# Patient Record
Sex: Female | Born: 1965
Health system: Southern US, Community
[De-identification: ages and names within clinical notes are randomized; demographics above are authoritative.]

## PROBLEM LIST (undated history)

## (undated) DIAGNOSIS — C44301 Unspecified malignant neoplasm of skin of nose: Secondary | ICD-10-CM

## (undated) HISTORY — DX: Unspecified malignant neoplasm of skin of nose: C44.301

## (undated) HISTORY — PX: CYSTECTOMY: SUR359

---

## 1998-09-04 ENCOUNTER — Other Ambulatory Visit: Admission: RE | Admit: 1998-09-04 | Discharge: 1998-09-04 | Payer: Self-pay | Admitting: *Deleted

## 1999-08-02 ENCOUNTER — Emergency Department (HOSPITAL_COMMUNITY): Admission: EM | Admit: 1999-08-02 | Discharge: 1999-08-02 | Payer: Self-pay | Admitting: *Deleted

## 1999-09-25 ENCOUNTER — Other Ambulatory Visit: Admission: RE | Admit: 1999-09-25 | Discharge: 1999-09-25 | Payer: Self-pay | Admitting: *Deleted

## 2000-05-26 ENCOUNTER — Inpatient Hospital Stay (HOSPITAL_COMMUNITY): Admission: AD | Admit: 2000-05-26 | Discharge: 2000-05-26 | Payer: Self-pay | Admitting: Obstetrics and Gynecology

## 2000-08-07 ENCOUNTER — Inpatient Hospital Stay (HOSPITAL_COMMUNITY): Admission: AD | Admit: 2000-08-07 | Discharge: 2000-08-09 | Payer: Self-pay | Admitting: *Deleted

## 2000-09-22 ENCOUNTER — Other Ambulatory Visit: Admission: RE | Admit: 2000-09-22 | Discharge: 2000-09-22 | Payer: Self-pay | Admitting: *Deleted

## 2001-11-27 ENCOUNTER — Other Ambulatory Visit: Admission: RE | Admit: 2001-11-27 | Discharge: 2001-11-27 | Payer: Self-pay | Admitting: *Deleted

## 2002-12-14 ENCOUNTER — Other Ambulatory Visit: Admission: RE | Admit: 2002-12-14 | Discharge: 2002-12-14 | Payer: Self-pay | Admitting: *Deleted

## 2003-08-03 HISTORY — PX: SKIN CANCER EXCISION: SHX779

## 2004-02-17 ENCOUNTER — Other Ambulatory Visit: Admission: RE | Admit: 2004-02-17 | Discharge: 2004-02-17 | Payer: Self-pay | Admitting: Obstetrics and Gynecology

## 2004-12-21 ENCOUNTER — Ambulatory Visit: Payer: Self-pay | Admitting: Internal Medicine

## 2005-04-15 ENCOUNTER — Other Ambulatory Visit: Admission: RE | Admit: 2005-04-15 | Discharge: 2005-04-15 | Payer: Self-pay | Admitting: Obstetrics and Gynecology

## 2005-05-03 ENCOUNTER — Ambulatory Visit: Payer: Self-pay | Admitting: Internal Medicine

## 2005-10-22 ENCOUNTER — Ambulatory Visit: Payer: Self-pay | Admitting: Internal Medicine

## 2006-06-16 ENCOUNTER — Ambulatory Visit: Payer: Self-pay | Admitting: Internal Medicine

## 2006-06-16 LAB — CONVERTED CEMR LAB
ALT: 13 units/L (ref 0–40)
AST: 20 units/L (ref 0–37)
Albumin: 3.4 g/dL — ABNORMAL LOW (ref 3.5–5.2)
Alkaline Phosphatase: 48 units/L (ref 39–117)
BUN: 6 mg/dL (ref 6–23)
Basophils Absolute: 0 10*3/uL (ref 0.0–0.1)
Basophils Relative: 0.1 % (ref 0.0–1.0)
CO2: 29 meq/L (ref 19–32)
Calcium: 8.7 mg/dL (ref 8.4–10.5)
Chloride: 102 meq/L (ref 96–112)
Chol/HDL Ratio, serum: 3.2
Cholesterol: 160 mg/dL (ref 0–200)
Creatinine, Ser: 0.9 mg/dL (ref 0.4–1.2)
Eosinophil percent: 4.8 % (ref 0.0–5.0)
GFR calc non Af Amer: 74 mL/min
Glomerular Filtration Rate, Af Am: 90 mL/min/{1.73_m2}
Glucose, Bld: 84 mg/dL (ref 70–99)
HCT: 41.2 % (ref 36.0–46.0)
HDL: 50.5 mg/dL (ref >39.0)
Hemoglobin: 13.7 g/dL (ref 12.0–15.0)
LDL Cholesterol: 94 mg/dL (ref 0–99)
Lymphocytes Relative: 30.3 % (ref 12.0–46.0)
MCHC: 33.1 g/dL (ref 30.0–36.0)
MCV: 92.7 fL (ref 78.0–100.0)
Monocytes Absolute: 0.4 10*3/uL (ref 0.2–0.7)
Monocytes Relative: 7.1 % (ref 3.0–11.0)
Neutro Abs: 3.1 10*3/uL (ref 1.4–7.7)
Neutrophils Relative %: 57.7 % (ref 43.0–77.0)
Platelets: 281 10*3/uL (ref 150–400)
Potassium: 4 meq/L (ref 3.5–5.1)
RBC: 4.44 M/uL (ref 3.87–5.11)
RDW: 11.6 % (ref 11.5–14.6)
Sodium: 138 meq/L (ref 135–145)
TSH: 1.87 microintl units/mL (ref 0.35–5.50)
Total Bilirubin: 0.6 mg/dL (ref 0.3–1.2)
Total Protein: 6.5 g/dL (ref 6.0–8.3)
Triglyceride fasting, serum: 78 mg/dL (ref 0–149)
VLDL: 16 mg/dL (ref 0–40)
WBC: 5.5 10*3/uL (ref 4.5–10.5)

## 2006-06-30 ENCOUNTER — Ambulatory Visit: Payer: Self-pay | Admitting: Internal Medicine

## 2008-05-20 ENCOUNTER — Encounter: Payer: Self-pay | Admitting: Internal Medicine

## 2010-02-19 ENCOUNTER — Ambulatory Visit: Payer: Self-pay | Admitting: Internal Medicine

## 2010-03-19 ENCOUNTER — Ambulatory Visit: Payer: Self-pay | Admitting: Internal Medicine

## 2010-03-19 LAB — CONVERTED CEMR LAB
ALT: 12 units/L (ref 0–35)
AST: 24 units/L (ref 0–37)
Albumin: 3.8 g/dL (ref 3.5–5.2)
Alkaline Phosphatase: 60 units/L (ref 39–117)
BUN: 11 mg/dL (ref 6–23)
Basophils Absolute: 0 10*3/uL (ref 0.0–0.1)
Basophils Relative: 0.6 % (ref 0.0–3.0)
Bilirubin Urine: NEGATIVE
Bilirubin, Direct: 0.1 mg/dL (ref 0.0–0.3)
CO2: 25 meq/L (ref 19–32)
Calcium: 8.9 mg/dL (ref 8.4–10.5)
Chloride: 108 meq/L (ref 96–112)
Cholesterol: 192 mg/dL (ref 0–200)
Creatinine, Ser: 1.1 mg/dL (ref 0.4–1.2)
Eosinophils Absolute: 0.2 10*3/uL (ref 0.0–0.7)
Eosinophils Relative: 3.2 % (ref 0.0–5.0)
GFR calc non Af Amer: 56.86 mL/min (ref >60)
Glucose, Bld: 130 mg/dL — ABNORMAL HIGH (ref 70–99)
HCT: 34.2 % — ABNORMAL LOW (ref 36.0–46.0)
HDL: 58.2 mg/dL (ref >39.00)
Hemoglobin, Urine: NEGATIVE
Hemoglobin: 11.6 g/dL — ABNORMAL LOW (ref 12.0–15.0)
Ketones, ur: NEGATIVE mg/dL
LDL Cholesterol: 118 mg/dL — ABNORMAL HIGH (ref 0–99)
Lymphocytes Relative: 26.5 % (ref 12.0–46.0)
Lymphs Abs: 1.9 10*3/uL (ref 0.7–4.0)
MCHC: 33.8 g/dL (ref 30.0–36.0)
MCV: 82.1 fL (ref 78.0–100.0)
Monocytes Absolute: 0.4 10*3/uL (ref 0.1–1.0)
Monocytes Relative: 6.2 % (ref 3.0–12.0)
Neutro Abs: 4.6 10*3/uL (ref 1.4–7.7)
Neutrophils Relative %: 63.5 % (ref 43.0–77.0)
Nitrite: NEGATIVE
Platelets: 315 10*3/uL (ref 150.0–400.0)
Potassium: 4.2 meq/L (ref 3.5–5.1)
RBC: 4.17 M/uL (ref 3.87–5.11)
RDW: 14.6 % (ref 11.5–14.6)
Sodium: 141 meq/L (ref 135–145)
Specific Gravity, Urine: 1.01 (ref 1.000–1.030)
TSH: 1.85 microintl units/mL (ref 0.35–5.50)
Total Bilirubin: 0.6 mg/dL (ref 0.3–1.2)
Total CHOL/HDL Ratio: 3
Total Protein, Urine: NEGATIVE mg/dL
Total Protein: 7.1 g/dL (ref 6.0–8.3)
Triglycerides: 77 mg/dL (ref 0.0–149.0)
Urine Glucose: NEGATIVE mg/dL
Urobilinogen, UA: 0.2 (ref 0.0–1.0)
VLDL: 15.4 mg/dL (ref 0.0–40.0)
WBC: 7.2 10*3/uL (ref 4.5–10.5)
pH: 7.5 (ref 5.0–8.0)

## 2010-06-24 ENCOUNTER — Ambulatory Visit: Payer: Self-pay | Admitting: Internal Medicine

## 2010-08-22 ENCOUNTER — Encounter: Payer: Self-pay | Admitting: Surgery

## 2010-09-01 NOTE — Assessment & Plan Note (Signed)
Summary: cough/chest congestion/shortness of breath/cjr   Vital Signs:  Patient profile:   45 year old female Menstrual status:  regular Weight:      150 pounds O2 Sat:      99 % on Room air Temp:     98.1 degrees F oral Pulse rate:   75 / minute BP sitting:   112 / 92  (left arm) Cuff size:   regular  Vitals Entered By: Alfred Levins, CMA (June 24, 2010 9:28 AM)  O2 Flow:  Room air CC: feels sob, cough, no energy x3wks   CC:  feels sob, cough, and no energy x3wks.  History of Present Illness: 23 day hx of cough and lack of energy.  no fever or chills she does have some SOB---exacerbated by physical activity SOB really described as a sensation of needing to take a deep breath.   Current Medications (verified): 1)  Aviane 0.1-20 Mg-Mcg Tabs (Levonorgestrel-Ethinyl Estrad) .... Once Daily  Allergies (verified): 1)  ! Penicillin  Physical Exam  General:  well-developed thin female in no acute distress. HEENT exam atraumatic, normocephalic symmetric her muscles are intact for our Chalmers Guest is normal. Chest clear to auscultation cardiac exam S1-S2 are regular. With forced expiration she does have minimal wheezing.   Impression & Recommendations:  Problem # 1:  COUGH (ICD-786.2) long-term cough. Unknown etiology. I think it's best to treat this is an atypical infection. I've given her doxycycline. She does have minimal evidence of bronchospasm. I'll give her an albuterol inhaler. I'll expect this to improve over the next week. If it doesn't she'll call me and I'll consider chest x-ray and further evaluation.  Side effects discussed.  Complete Medication List: 1)  Aviane 0.1-20 Mg-mcg Tabs (Levonorgestrel-ethinyl estrad) .... Once daily 2)  Doxycycline Hyclate 100 Mg Caps (Doxycycline hyclate) .... Take 1 tab twice a day 3)  Proair Hfa 108 (90 Base) Mcg/act Aers (Albuterol sulfate) .... 2 puffs three times a day as needed cough and wheeze  Patient Instructions: 1)   . Prescriptions: PROAIR HFA 108 (90 BASE) MCG/ACT  AERS (ALBUTEROL SULFATE) 2 puffs three times a day as needed cough and wheeze  #1 x 0   Entered and Authorized by:   Birdie Sons MD   Signed by:   Birdie Sons MD on 06/24/2010   Method used:   Electronically to        [id number] (retail)       [address]       [place], [place]  [address]       Ph: [phone] or [phone]       Fax: [phone]   RxID:   [id number] DOXYCYCLINE HYCLATE 100 MG CAPS (DOXYCYCLINE HYCLATE) Take 1 tab twice a day  #20 x 0   Entered and Authorized by:   Birdie Sons MD   Signed by:   Birdie Sons MD on 06/24/2010   Method used:   Electronically to        [id number] (retail)       [address]       [place], [place]  [address]       Ph: [phone] or [phone]       Fax: [phone]   RxID:   [id number]    Orders Added: 1)  Est. Patient Level III [16073]

## 2010-09-01 NOTE — Assessment & Plan Note (Signed)
Summary: cpx/no pap/pt will come in fasting/njr/pt rsc/cjr/pt rsc from...   Vital Signs:  Patient profile:   45 year old female Menstrual status:  regular LMP:     03/11/2010 Height:      66.5 inches Weight:      149 pounds BMI:     23.77 Pulse rate:   60 / minute Pulse rhythm:   regular Resp:     12 per minute BP sitting:   124 / 86  (left arm) Cuff size:   regular  Vitals Entered By: Gladis Riffle, RN (March 19, 2010 10:27  AM)                             \\                                                                                                                                                                                                                                                                                                                                                                                                                                                                                                                                                                                                                                                                                                                                                                                                                                                                                                                                                                                                                                                                                                                                                                                                                                                                                                                                                                                                                                                                                                                                                                                                                                                                                                                                                                                                                                                                                                                                                                                                                                                                                                                                                                                                                                                                                                                                                                                                                                                                                                                                                                                                                                                                                                                                                                                                                                                                                                                                                                                                                                                                                                                                                                                                                                                                                                                                                                                                                                                                                                                                                                                                                                                                                                                                                                                                                                                                                                                                                                                                        \    \  CC: cpx, has gyn--labs  done Is Patient Diabetic? No LMP (date): 03/11/2010     Menstrual Status regular Enter LMP: 03/11/2010   CC:  cpx and has gyn--labs done.  History of Present Illness: cpx  Preventive Screening-Counseling & Management  Alcohol-Tobacco     Smoking Status: never  Current Problems (verified): None  Current Medications (verified): 1)  Aviane 0.1-20 Mg-Mcg Tabs (Levonorgestrel-Ethinyl Estrad) .... Once Daily  Allergies (verified): 1)  ! Penicillin  Past History:  Family History: Last updated: 03/19/2010 parents alive and well  Social History: Last updated: 03/19/2010 Married Never Smoked Alcohol use-yes---minimal 2 kids healthy occupation: speech and language  Risk Factors: Smoking Status: never (03/19/2010)  Family History: parents alive and well  Social History: Married Never Smoked Alcohol use-yes---minimal 2 kids healthy occupation: speech and language Smoking Status:  never  Physical Exam  General:  alert and well-developed.   Head:  normocephalic and atraumatic.   Eyes:  pupils equal and pupils round.   Ears:  R ear normal and L ear normal.   Neck:  No deformities, masses, or tenderness noted. Lungs:  normal respiratory effort and no intercostal retractions.   Heart:  normal rate and regular rhythm.   Abdomen:  soft and non-tender.   Msk:  No deformity or scoliosis noted of thoracic or lumbar spine.   Pulses:  R radial normal and L radial normal.   Neurologic:  cranial nerves II-XII intact and gait normal.   Skin:  turgor normal and color normal.   Psych:  good eye contact and not anxious appearing.     Impression & Recommendations:  Problem # 1:  PREVENTIVE HEALTH CARE (ICD-V70.0) health maint UTD encouraged to continue exercise habits  Complete Medication List: 1)  Aviane 0.1-20 Mg-mcg Tabs (Levonorgestrel-ethinyl estrad) .... Once daily 2)  Ferrous Sulfate 325 (65 Fe) Mg Tabs (Ferrous sulfate) .... Two times a day

## 2011-08-03 LAB — HM PAP SMEAR

## 2011-11-04 ENCOUNTER — Ambulatory Visit (INDEPENDENT_AMBULATORY_CARE_PROVIDER_SITE_OTHER): Payer: 59 | Admitting: Sports Medicine

## 2011-11-04 VITALS — BP 138/90 | Ht 67.0 in | Wt 148.0 lb

## 2011-11-04 DIAGNOSIS — M25569 Pain in unspecified knee: Secondary | ICD-10-CM

## 2011-11-04 DIAGNOSIS — M25561 Pain in right knee: Secondary | ICD-10-CM

## 2011-11-04 NOTE — Patient Instructions (Addendum)
Use patellar strap for activity during the next 3 months  For squats - use drop squat - down 30 to 45 deg and up slowly  Avoid deep knee bends   Ice after activity   Please follow up in 6 weeks  Thank you for seeing [place] today!

## 2011-11-04 NOTE — Assessment & Plan Note (Signed)
Modify exercises   use patellar strap Modify exercises OK to run  Use arch support in all shoes Reck 6 wks   If not enough relief she might need custom orthotic

## 2011-11-04 NOTE — Progress Notes (Signed)
  Subjective:    Patient ID: Cathy Scott, female    DOB: 23-Aug-1965, 46 y.o.   MRN: [id number]  HPI  Pt presents to clinic for eval of rt anterior knee pain since last May. Has slight swelling. Knee feels weak at times esp in spin class.  Pain worse with going downstairs. Also has some rt hip pain.  Runs about 15 mpw. Has significant anterior knee pain with squats and lunges.  Daughter is excellent runner/  She wants to keep running some with her    Review of Systems     Objective:  Physical Exam NAD  Rt knee and hip exam: Good quad strength Leg lengths equal Alignment good MCL and LCL stable Lachman neg Neg patellar compression Hip abduction strength good   Knee: Normal to inspection with no erythema or effusion or obvious bony abnormalities. Palpation normal with no warmth or joint line tenderness or patellar tenderness or condyle tenderness. ROM normal in flexion and extension and lower leg rotation. Ligaments with solid consistent endpoints including ACL, PCL, LCL, MCL. Negative Mcmurray's and provocative meniscal tests. Non painful patellar compression but does feel some crepitation Patellar and quadriceps tendons unremarkable. Hamstring and quadriceps strength is normal.   Cavus foot bilat  Running gait is neutral          Assessment & Plan:

## 2011-11-11 ENCOUNTER — Other Ambulatory Visit: Payer: Self-pay

## 2011-11-16 ENCOUNTER — Encounter: Payer: Self-pay | Admitting: Internal Medicine

## 2011-12-08 ENCOUNTER — Other Ambulatory Visit: Payer: 59

## 2011-12-15 ENCOUNTER — Encounter: Payer: Self-pay | Admitting: Internal Medicine

## 2012-01-06 ENCOUNTER — Other Ambulatory Visit: Payer: 59

## 2012-01-11 ENCOUNTER — Encounter: Payer: Self-pay | Admitting: Internal Medicine

## 2012-01-13 ENCOUNTER — Other Ambulatory Visit (INDEPENDENT_AMBULATORY_CARE_PROVIDER_SITE_OTHER): Payer: 59

## 2012-01-13 DIAGNOSIS — Z0000 Encounter for general adult medical examination without abnormal findings: Secondary | ICD-10-CM

## 2012-01-13 LAB — LIPID PANEL
Cholesterol: 175 mg/dL (ref 0–200)
HDL: 63.3 mg/dL (ref >39.00)
LDL Cholesterol: 98 mg/dL (ref 0–99)
Total CHOL/HDL Ratio: 3
Triglycerides: 70 mg/dL (ref 0.0–149.0)
VLDL: 14 mg/dL (ref 0.0–40.0)

## 2012-01-13 LAB — HEPATIC FUNCTION PANEL
ALT: 17 U/L (ref 0–35)
AST: 21 U/L (ref 0–37)
Albumin: 3.6 g/dL (ref 3.5–5.2)
Alkaline Phosphatase: 61 U/L (ref 39–117)
Bilirubin, Direct: 0 mg/dL (ref 0.0–0.3)
Total Bilirubin: 1 mg/dL (ref 0.3–1.2)
Total Protein: 6.8 g/dL (ref 6.0–8.3)

## 2012-01-13 LAB — POCT URINALYSIS DIPSTICK
Bilirubin, UA: NEGATIVE
Blood, UA: NEGATIVE
Glucose, UA: NEGATIVE
Ketones, UA: NEGATIVE
Leukocytes, UA: NEGATIVE
Nitrite, UA: NEGATIVE
Protein, UA: NEGATIVE
Spec Grav, UA: 1.01
Urobilinogen, UA: 0.2
pH, UA: 7

## 2012-01-13 LAB — CBC WITH DIFFERENTIAL/PLATELET
Basophils Absolute: 0 10*3/uL (ref 0.0–0.1)
Basophils Relative: 0.6 % (ref 0.0–3.0)
Eosinophils Absolute: 0.2 10*3/uL (ref 0.0–0.7)
Eosinophils Relative: 2.2 % (ref 0.0–5.0)
HCT: 41.7 % (ref 36.0–46.0)
Hemoglobin: 13.8 g/dL (ref 12.0–15.0)
Lymphocytes Relative: 20.5 % (ref 12.0–46.0)
Lymphs Abs: 1.4 10*3/uL (ref 0.7–4.0)
MCHC: 33.1 g/dL (ref 30.0–36.0)
MCV: 95.6 fl (ref 78.0–100.0)
Monocytes Absolute: 0.5 10*3/uL (ref 0.1–1.0)
Monocytes Relative: 7.7 % (ref 3.0–12.0)
Neutro Abs: 4.7 10*3/uL (ref 1.4–7.7)
Neutrophils Relative %: 69 % (ref 43.0–77.0)
Platelets: 229 10*3/uL (ref 150.0–400.0)
RBC: 4.36 Mil/uL (ref 3.87–5.11)
RDW: 12.7 % (ref 11.5–14.6)
WBC: 6.8 10*3/uL (ref 4.5–10.5)

## 2012-01-13 LAB — BASIC METABOLIC PANEL
BUN: 12 mg/dL (ref 6–23)
CO2: 26 mEq/L (ref 19–32)
Calcium: 8.7 mg/dL (ref 8.4–10.5)
Chloride: 102 mEq/L (ref 96–112)
Creatinine, Ser: 0.8 mg/dL (ref 0.4–1.2)
GFR: 83.46 mL/min (ref >60.00)
Glucose, Bld: 80 mg/dL (ref 70–99)
Potassium: 3.9 mEq/L (ref 3.5–5.1)
Sodium: 136 mEq/L (ref 135–145)

## 2012-01-13 LAB — TSH: TSH: 1.24 u[IU]/mL (ref 0.35–5.50)

## 2012-01-20 ENCOUNTER — Encounter: Payer: Self-pay | Admitting: Internal Medicine

## 2012-02-28 ENCOUNTER — Encounter: Payer: Self-pay | Admitting: Internal Medicine

## 2012-03-10 ENCOUNTER — Encounter: Payer: Self-pay | Admitting: Internal Medicine

## 2012-03-10 ENCOUNTER — Ambulatory Visit (INDEPENDENT_AMBULATORY_CARE_PROVIDER_SITE_OTHER): Payer: 59 | Admitting: Internal Medicine

## 2012-03-10 VITALS — BP 134/94 | HR 76 | Temp 97.9°F | Ht 66.5 in | Wt 151.0 lb

## 2012-03-10 DIAGNOSIS — Z0000 Encounter for general adult medical examination without abnormal findings: Secondary | ICD-10-CM

## 2012-03-10 NOTE — Progress Notes (Signed)
Patient ID: Cathy Scott, female   DOB: Apr 11, 1966, 46 y.o.   MRN: [id number] Well Visit:  Past Medical History  Diagnosis Date  . Skin cancer of nose     History   Social History  . Marital Status: Single    Spouse Name: N/A    Number of Children: N/A  . Years of Education: N/A   Occupational History  . Not on file.   Social History Main Topics  . Smoking status: Never Smoker   . Smokeless tobacco: Not on file  . Alcohol Use: Yes  . Drug Use: No  . Sexually Active: Not on file   Other Topics Concern  . Not on file   Social History Narrative  . No narrative on file    Past Surgical History  Procedure Date  . Cystectomy     from foot at age 57  . Skin cancer excision 2005    nose    Family History  Problem Relation Age of Onset  . Hyperlipidemia Mother     Allergies  Allergen Reactions  . Penicillins     REACTION: rash    Current Outpatient Prescriptions on File Prior to Visit  Medication Sig Dispense Refill  . ORSYTHIA 0.1-20 MG-MCG tablet Take 1 tablet by mouth daily.         patient denies chest pain, shortness of breath, orthopnea. Denies lower extremity edema, abdominal pain, change in appetite, change in bowel movements. Patient denies rashes, musculoskeletal complaints. No other specific complaints in a complete review of systems.   BP 134/94  Pulse 76  Temp 97.9 F (36.6 C) (Oral)  Ht 5' 6.5" (1.689 m)  Wt 151 lb (68.493 kg)  BMI 24.01 kg/m2  Well-developed well-nourished female in no acute distress. HEENT exam atraumatic, normocephalic, extraocular muscles are intact. Neck is supple. No jugular venous distention no thyromegaly. Chest clear to auscultation without increased work of breathing. Cardiac exam S1 and S2 are regular. Abdominal exam active bowel sounds, soft, nontender. Extremities no edema. Neurologic exam she is alert without any motor sensory deficits. Gait is normal.   A/P- Well visit - Health maint UTD

## 2012-08-31 ENCOUNTER — Ambulatory Visit (INDEPENDENT_AMBULATORY_CARE_PROVIDER_SITE_OTHER): Payer: 59 | Admitting: Psychology

## 2012-08-31 DIAGNOSIS — F4323 Adjustment disorder with mixed anxiety and depressed mood: Secondary | ICD-10-CM

## 2012-09-14 ENCOUNTER — Ambulatory Visit: Payer: 59 | Admitting: Psychology

## 2013-08-31 ENCOUNTER — Ambulatory Visit: Payer: Self-pay | Admitting: Family Medicine

## 2013-08-31 ENCOUNTER — Encounter (INDEPENDENT_AMBULATORY_CARE_PROVIDER_SITE_OTHER): Payer: Self-pay

## 2013-08-31 ENCOUNTER — Encounter: Payer: Self-pay | Admitting: Family Medicine

## 2013-08-31 ENCOUNTER — Ambulatory Visit (INDEPENDENT_AMBULATORY_CARE_PROVIDER_SITE_OTHER): Payer: 59 | Admitting: Family Medicine

## 2013-08-31 ENCOUNTER — Ambulatory Visit (HOSPITAL_BASED_OUTPATIENT_CLINIC_OR_DEPARTMENT_OTHER)
Admission: RE | Admit: 2013-08-31 | Discharge: 2013-08-31 | Disposition: A | Discharge status: Home / Self Care | Payer: 59 | Source: Ambulatory Visit | Attending: Family Medicine | Admitting: Family Medicine

## 2013-08-31 VITALS — Ht 67.0 in | Wt 148.0 lb

## 2013-08-31 DIAGNOSIS — M25552 Pain in left hip: Secondary | ICD-10-CM | POA: Insufficient documentation

## 2013-08-31 DIAGNOSIS — M79609 Pain in unspecified limb: Secondary | ICD-10-CM

## 2013-08-31 DIAGNOSIS — M79605 Pain in left leg: Secondary | ICD-10-CM

## 2013-08-31 DIAGNOSIS — M25559 Pain in unspecified hip: Secondary | ICD-10-CM

## 2013-08-31 NOTE — Progress Notes (Addendum)
Patient ID: Cathy Scott, female   DOB: [birth year], 48 y.o.   MRN: [id number]  PCP: Chancy Hurter, MD  Subjective:   HPI: Patient is a 48 y.o. female here for left leg pain.  Patient reports pain in left posterior hip started on 12/26. She was running about 4 miles 4 times a week. This particular day she was running in [place] on a cross country course - no pain during this but afterwards had pain in posterior hip. Stretching didn't seem to help with this. Next couple runs she had pain for first mile but it improved during run. Then last Monday had pain that lasted throughout run and worse afterwards. Hurt to use stairs and just to walk. Tried rower, elliptical - pain bad with the rower. Not tried any meds or icing. Stopped running since that time. Pain now a 5 out of 10. No history of stress fracture.  Past Medical History  Diagnosis Date  . Skin cancer of nose     Current Outpatient Prescriptions on File Prior to Visit  Medication Sig Dispense Refill  . ORSYTHIA 0.1-20 MG-MCG tablet Take 1 tablet by mouth daily.       No current facility-administered medications on file prior to visit.    Past Surgical History  Procedure Laterality Date  . Cystectomy      from foot at age 46  . Skin cancer excision  2005    nose    Allergies  Allergen Reactions  . Penicillins     REACTION: rash    History   Social History  . Marital Status: Single    Spouse Name: N/A    Number of Children: N/A  . Years of Education: N/A   Occupational History  . Not on file.   Social History Main Topics  . Smoking status: Never Smoker   . Smokeless tobacco: Not on file  . Alcohol Use: Yes  . Drug Use: No  . Sexual Activity: Not on file   Other Topics Concern  . Not on file   Social History Narrative  . No narrative on file    Family History  Problem Relation Age of Onset  . Hyperlipidemia Mother     Ht 5\' 7"  (1.702 m)  Wt 148 lb (67.132 kg)  BMI 23.17 kg/m2   LMP 08/17/2013  Review of Systems: See HPI above.    Objective:  Physical Exam:  Gen: NAD  Left hip/Back: No gross deformity, scoliosis. TTP deep proximal posterior femur - deeper than hamstring .  No midline or bony TTP. FROM with pain mildly on hip ROM. Strength LEs 5/5 all muscle groups.  No pain resisted knee flexion at 30 and 90 degrees. 2+ MSRs in patellar and achilles tendons, equal bilaterally. Negative SLRs. Sensation intact to light touch bilaterally. Negative logroll bilateral hips Negative fabers and piriformis stretches. Able to complete hop test though mild pain. Negative fulcrum.    Assessment & Plan:  1. Left hip pain - concerning for proximal femur or hip stress fracture.  Possible she has a mild proximal hamstring tendinopathy.  Will go ahead with x-rays which were negative and bone scan to assess for stress fracture.  No weight bearing physical activity in the meantime.    Addendum:  Bone scan reviewed and discussed with patient.  No evidence of stress fracture.  Suspect proximal hamstring tendinopathy as the cause.  Start with physical therapy.  Discussed compression, nsaids, icing, relative rest.  Consider nitro patches if not improving.

## 2013-08-31 NOTE — Patient Instructions (Signed)
Get x-rays of your femur before you leave today. We will go ahead with bone scan to assess for stress fracture if x-rays are negative. No running, weight bearing exercise until we get the results of bone scan. Follow up and further treatment will depend on those results.

## 2013-08-31 NOTE — Assessment & Plan Note (Signed)
concerning for proximal femur or hip stress fracture.  Possible she has a mild proximal hamstring tendinopathy.  Will go ahead with x-rays which were negative and bone scan to assess for stress fracture.  No weight bearing physical activity in the meantime.

## 2013-09-11 ENCOUNTER — Encounter (HOSPITAL_COMMUNITY)
Admission: RE | Admit: 2013-09-11 | Discharge: 2013-09-11 | Disposition: A | Discharge status: Home / Self Care | Payer: BC Managed Care – PPO | Source: Ambulatory Visit | Attending: Family Medicine | Admitting: Family Medicine

## 2013-09-11 DIAGNOSIS — M79605 Pain in left leg: Secondary | ICD-10-CM

## 2013-09-11 DIAGNOSIS — M25559 Pain in unspecified hip: Secondary | ICD-10-CM | POA: Insufficient documentation

## 2013-09-11 DIAGNOSIS — M25552 Pain in left hip: Secondary | ICD-10-CM

## 2013-09-11 DIAGNOSIS — M79609 Pain in unspecified limb: Secondary | ICD-10-CM | POA: Insufficient documentation

## 2013-09-11 MED ORDER — TECHNETIUM TC 99M MEDRONATE IV KIT
24.4000 | PACK | Freq: Once | INTRAVENOUS | Status: AC | PRN
  Administered 2013-09-11: 24.4 via INTRAVENOUS

## 2013-09-13 ENCOUNTER — Other Ambulatory Visit: Payer: Self-pay | Admitting: *Deleted

## 2013-09-13 DIAGNOSIS — S76319A Strain of muscle, fascia and tendon of the posterior muscle group at thigh level, unspecified thigh, initial encounter: Secondary | ICD-10-CM

## 2013-09-18 ENCOUNTER — Ambulatory Visit: Payer: 59

## 2014-10-31 ENCOUNTER — Other Ambulatory Visit: Payer: Self-pay | Admitting: Obstetrics and Gynecology

## 2014-10-31 DIAGNOSIS — Z803 Family history of malignant neoplasm of breast: Secondary | ICD-10-CM

## 2018-01-09 ENCOUNTER — Ambulatory Visit (INDEPENDENT_AMBULATORY_CARE_PROVIDER_SITE_OTHER): Payer: BLUE CROSS/BLUE SHIELD | Admitting: Sports Medicine

## 2018-01-09 ENCOUNTER — Ambulatory Visit
Admission: RE | Admit: 2018-01-09 | Discharge: 2018-01-09 | Disposition: A | Discharge status: Home / Self Care | Payer: Self-pay | Source: Ambulatory Visit | Attending: Sports Medicine | Admitting: Sports Medicine

## 2018-01-09 VITALS — BP 110/74 | Ht 67.0 in | Wt 150.0 lb

## 2018-01-09 DIAGNOSIS — M79675 Pain in left toe(s): Secondary | ICD-10-CM

## 2018-01-09 NOTE — Progress Notes (Signed)
   Subjective:    Patient ID: Cathy Scott, female    DOB: 17-Apr-1966, 52 y.o.   MRN: [id number]  HPI Pt presents saying that she is unsure of the exact pain timeline due to having a high pain tolerance. She presents with 2 separate concerns, B bunion pain and L 4th toe pain. Her B bunions have been hurting her for the past 2-4 month, primarily beneath B great toes with running. She usually runs 4 miles and they start hurting about 2 miles into her run but she is able to tolerate the pain and finish her full run. She has not run consistently for about 2 months but usually runs 4 miles 2x/week. Last Wednesday (5d ago) she ran 4 miles and started having pain at the 2 mile mark. She says that wearing fur-lined slippers that hug her feet feels good but that the pressure of shoes makes her feel worse. She intentionally wears shoes that are supportive for her forefoot and saw Dr. Oneida Alar for this 3-4 yrs ago and he placed a "pad under her big toes." The pain typically lasts a few hours and gets up to 6-7/10. No pain now. No meds or ice tried. Relatively new running shoes with no change in brand ([employer]). Her second concern that prompted her to come in is pain with occasional redness and swelling in her L 4th toe laterally for the past 2 months. She initially thought that she might have been bitten by an insect. She gets pain, numbness, and a burning sensation there that is worse when any pressure is applied to her toes, including wearing her running shoes. She takes a MVI and turmeric.    Review of Systems As above    Objective:   Physical Exam Seated on exam table in NAD, has Birkenstocks and [employer] running shoes with her. B foot inspection reveals early changes of bunions; L 4th toe appears slightly more prominent laterally than the R 4th toe. Palpation reveals ? minimally TTP at first MTP joint B; L 4th toe is mildly TTP laterally; no evidence of L Morton's neuroma NL ROM of toes, particularly no  crepitus or stiffness with ROM of B great toes at MTP joints, NL ROM of L 4th toe NL strength of toes Toes are stable NVI on exam B feet and toes with 2/2 B DP and PT pulses and NL skin color and cap refill (It does sound like there is some intermittent decreased sensation L 4th toe laterally but this was not detected on exam.)        Assessment & Plan:  1. B bunion pain plantar aspect 2. L 4th digital sensory nerve irritation with associated pain  Bunions tend to be progressive and they are currently symptomatic for patient. Metatarsal pads B to relieve pressure. 1 pair placed in [employer] running shoes. 2 pairs for home. X-ray of 4th toe, left foot Patient is not interested in any medication. F/u in 1 month for re-eval.  Addendum: X-rays of the left fourth toe reviewed. Other than some mild degenerative changes, the film is unremarkable. Patient is definitely getting some digital nerve irritation in this toe.She'll follow-up with me in 4 weeks for reevaluation. I may consider referral to podiatry at that time.

## 2018-01-10 ENCOUNTER — Encounter: Payer: Self-pay | Admitting: Sports Medicine

## 2019-05-08 DIAGNOSIS — F4323 Adjustment disorder with mixed anxiety and depressed mood: Secondary | ICD-10-CM | POA: Diagnosis not present

## 2019-05-22 DIAGNOSIS — L814 Other melanin hyperpigmentation: Secondary | ICD-10-CM | POA: Diagnosis not present

## 2019-05-22 DIAGNOSIS — D225 Melanocytic nevi of trunk: Secondary | ICD-10-CM | POA: Diagnosis not present

## 2019-05-22 DIAGNOSIS — L821 Other seborrheic keratosis: Secondary | ICD-10-CM | POA: Diagnosis not present

## 2019-05-22 DIAGNOSIS — L570 Actinic keratosis: Secondary | ICD-10-CM | POA: Diagnosis not present

## 2019-05-22 DIAGNOSIS — Z85828 Personal history of other malignant neoplasm of skin: Secondary | ICD-10-CM | POA: Diagnosis not present

## 2019-06-06 DIAGNOSIS — F4323 Adjustment disorder with mixed anxiety and depressed mood: Secondary | ICD-10-CM | POA: Diagnosis not present

## 2019-06-14 DIAGNOSIS — Z1211 Encounter for screening for malignant neoplasm of colon: Secondary | ICD-10-CM | POA: Diagnosis not present

## 2019-06-14 DIAGNOSIS — Z8379 Family history of other diseases of the digestive system: Secondary | ICD-10-CM | POA: Diagnosis not present

## 2019-06-19 DIAGNOSIS — F4323 Adjustment disorder with mixed anxiety and depressed mood: Secondary | ICD-10-CM | POA: Diagnosis not present

## 2019-07-05 DIAGNOSIS — F4323 Adjustment disorder with mixed anxiety and depressed mood: Secondary | ICD-10-CM | POA: Diagnosis not present

## 2019-07-16 DIAGNOSIS — F4323 Adjustment disorder with mixed anxiety and depressed mood: Secondary | ICD-10-CM | POA: Diagnosis not present

## 2019-08-09 DIAGNOSIS — F4323 Adjustment disorder with mixed anxiety and depressed mood: Secondary | ICD-10-CM | POA: Diagnosis not present

## 2019-08-21 DIAGNOSIS — F4323 Adjustment disorder with mixed anxiety and depressed mood: Secondary | ICD-10-CM | POA: Diagnosis not present

## 2019-08-31 ENCOUNTER — Encounter: Payer: Self-pay | Admitting: Family Medicine

## 2019-08-31 ENCOUNTER — Other Ambulatory Visit: Payer: Self-pay

## 2019-08-31 ENCOUNTER — Ambulatory Visit: Payer: BC Managed Care – PPO | Admitting: Family Medicine

## 2019-08-31 DIAGNOSIS — M79672 Pain in left foot: Secondary | ICD-10-CM | POA: Diagnosis not present

## 2019-08-31 DIAGNOSIS — M79673 Pain in unspecified foot: Secondary | ICD-10-CM | POA: Insufficient documentation

## 2019-08-31 NOTE — Progress Notes (Signed)
  Cathy Scott - 54 y.o. female MRN [id number]  Date of birth: 1965/11/09    SUBJECTIVE:      Chief Complaint:/ HPI:  Left forefoot pain for 2 to 3 weeks.  Intermittent.  There are certain positions where she can feel the area stretch.  Pain is mild.  Does not keep her from doing anything.  Has never had this before.  No new activities.  Runs twice a week 4 to 5 miles.  Walks or does other exercises other days.     OBJECTIVE: BP 138/70   Ht 5\' 7"  (1.702 m)   Wt 138 lb (62.6 kg)   BMI 21.61 kg/m   Physical Exam:  Vital signs are reviewed. GENERAL: Well-developed female no acute distress feet: Bilaterally she has some flattening of the transverse arch.  Mild first ray deviation.  Very slightly tender to palpation along the flexor houses longus. Dorsiflexion plantarflexion intact, first ray flexion extension normal, neurovascular status is intact. ultrasound: Left foot: First ray bone is viewed in longitudinal and axial positions and reveals no cortical defect.  The first MTP joint reveals some osteophytes, mild synovial thickening, no effusion.  Flexor houses longus muscle and tendon is intact.  There is a very small amount of edema seen in the flexor hallucis brevis but the muscle is otherwise intact.  No increased Doppler activity.  ASSESSMENT & PLAN:  See problem based charting & AVS for pt instructions. Foot pain Mild strain flexor houses longus/brevis.  She does have some flattening of the transverse arch which may be contributing.  Reports she is previously used small metatarsal pad on the right shoe but is not currently using that.  I think she would most benefit from home exercise program focusing on this area.  Expect it to resolve the next 2 to 3 weeks with home exercise program.  If not she will return, we would consider add addition of left small metatarsal pad.  Questions answered.

## 2019-08-31 NOTE — Assessment & Plan Note (Signed)
Mild strain flexor houses longus/brevis.  She does have some flattening of the transverse arch which may be contributing.  Reports she is previously used small metatarsal pad on the right shoe but is not currently using that.  I think she would most benefit from home exercise program focusing on this area.  Expect it to resolve the next 2 to 3 weeks with home exercise program.  If not she will return, we would consider add addition of left small metatarsal pad.  Questions answered.

## 2019-09-04 DIAGNOSIS — F4323 Adjustment disorder with mixed anxiety and depressed mood: Secondary | ICD-10-CM | POA: Diagnosis not present

## 2019-09-07 DIAGNOSIS — Z1211 Encounter for screening for malignant neoplasm of colon: Secondary | ICD-10-CM | POA: Diagnosis not present

## 2019-09-07 DIAGNOSIS — D120 Benign neoplasm of cecum: Secondary | ICD-10-CM | POA: Diagnosis not present

## 2019-09-07 DIAGNOSIS — K635 Polyp of colon: Secondary | ICD-10-CM | POA: Diagnosis not present

## 2019-09-07 DIAGNOSIS — K6389 Other specified diseases of intestine: Secondary | ICD-10-CM | POA: Diagnosis not present

## 2019-10-02 DIAGNOSIS — F4323 Adjustment disorder with mixed anxiety and depressed mood: Secondary | ICD-10-CM | POA: Diagnosis not present

## 2019-10-18 DIAGNOSIS — F4323 Adjustment disorder with mixed anxiety and depressed mood: Secondary | ICD-10-CM | POA: Diagnosis not present

## 2019-11-01 DIAGNOSIS — F4323 Adjustment disorder with mixed anxiety and depressed mood: Secondary | ICD-10-CM | POA: Diagnosis not present

## 2020-10-09 ENCOUNTER — Ambulatory Visit (INDEPENDENT_AMBULATORY_CARE_PROVIDER_SITE_OTHER): Payer: BC Managed Care – PPO | Admitting: Sports Medicine

## 2020-10-09 ENCOUNTER — Other Ambulatory Visit: Payer: Self-pay

## 2020-10-09 ENCOUNTER — Ambulatory Visit
Admission: RE | Admit: 2020-10-09 | Discharge: 2020-10-09 | Disposition: A | Discharge status: Home / Self Care | Payer: BC Managed Care – PPO | Source: Ambulatory Visit | Attending: Sports Medicine | Admitting: Sports Medicine

## 2020-10-09 VITALS — BP 124/86 | Ht 67.0 in | Wt 150.0 lb

## 2020-10-09 DIAGNOSIS — G8929 Other chronic pain: Secondary | ICD-10-CM

## 2020-10-09 DIAGNOSIS — M25511 Pain in right shoulder: Secondary | ICD-10-CM | POA: Diagnosis not present

## 2020-10-09 DIAGNOSIS — M25561 Pain in right knee: Secondary | ICD-10-CM | POA: Diagnosis not present

## 2020-10-09 DIAGNOSIS — M25562 Pain in left knee: Secondary | ICD-10-CM

## 2020-10-09 NOTE — Progress Notes (Signed)
   Subjective:    Patient ID: Cathy Scott, female    DOB: 1965/10/04, 55 y.o.   MRN: [id number]  HPI chief complaint: Bilateral knee pain and right shoulder pain  Patient comes in today with a couple of different complaints.  Main complaint is right shoulder pain that began a few months ago.  She first noticed it when having to help her ill dog up onto the couch.  She noticed that internal rotation of her shoulder while picking up his hind legs cause discomfort.  Unfortunately, since then her daughter has passed away.  She continues to endorse lateral shoulder pain with any sort of internal rotation of the shoulder.  She also notices discomfort with resisted overhead exercise.  She does endorse nighttime pain as well.  She also has chronic bilateral knee pain.  Right knee pain has gotten worse recently.  She initially had pain with lunges but now has pain with walking.  She notices crunching in both knees as well.  No recent trauma.  She has not noticed any swelling.  Past medical history reviewed Medications reviewed Allergies reviewed    Review of Systems    As above Objective:   Physical Exam  Well-developed, well-nourished.  No acute distress  Right shoulder: Full active range of motion with a positive painful arc.  No tenderness to palpation.  Positive Hawkins, negative empty can.  Rotator cuff strength is 5/5.  Negative O'Briens.  1-2+ laxity with anterior to posterior glenohumeral translation.  Positive sulcus.  Examination of both knees show full range of motion.  No obvious effusion.  2+ patellofemoral crepitus.  Knees are grossly stable to ligamentous exam.      Assessment & Plan:   Right shoulder pain likely secondary to rotator cuff impingement/rotator cuff strain Bilateral knee pain likely secondary to DJD  Patient will return to the office in a few days for complete right shoulder ultrasound.  We will go ahead and give her some Jobe exercises and scapular  stabilization exercises.  She definitely has underlying laxity in this right shoulder.  I recommended that she avoid repetitive overhead exercise.  In regards to her knees, I would like to start with getting x-rays including a standing AP and sunrise view.  We will discuss those x-ray findings at her follow-up visit.

## 2020-10-16 ENCOUNTER — Ambulatory Visit: Payer: Self-pay

## 2020-10-16 ENCOUNTER — Ambulatory Visit (INDEPENDENT_AMBULATORY_CARE_PROVIDER_SITE_OTHER): Payer: BC Managed Care – PPO | Admitting: Sports Medicine

## 2020-10-16 ENCOUNTER — Other Ambulatory Visit: Payer: Self-pay

## 2020-10-16 VITALS — BP 138/74

## 2020-10-16 DIAGNOSIS — M25511 Pain in right shoulder: Secondary | ICD-10-CM

## 2020-10-16 DIAGNOSIS — G8929 Other chronic pain: Secondary | ICD-10-CM

## 2020-10-16 DIAGNOSIS — M25562 Pain in left knee: Secondary | ICD-10-CM

## 2020-10-16 DIAGNOSIS — M25561 Pain in right knee: Secondary | ICD-10-CM

## 2020-10-16 NOTE — Progress Notes (Addendum)
Patient ID: CELISSE CIULLA, female   DOB: 04-20-1966, 55 y.o.   MRN: [id number]  Eylin comes in today to discuss x-rays of both knees as well as to undergo a right shoulder ultrasound.  X-ray of the right knee shows mild patellofemoral osteoarthritis.  Left knee x-ray is unremarkable.  Her symptoms are consistent with chondromalacia patella/early patellofemoral DJD.  We will give her some VMO exercises to work on this.  She will avoid any sort of exercise that puts the knee in extreme flexion.  If symptoms persist, consider merits of cortisone injection.  MSK ultrasound of the right shoulder:  Biceps tendon was well visualized in long and short axis without abnormality.  Acromioclavicular joint well visualized without any obvious degenerative changes.  Negative mushroom sign.  Supraspinatus, infraspinatus, and subscapularis were all visualized.  There is some hypoechoic change and some irregularity of the humeral head in the mid substance of the supraspinatus tendon consistent with probable partial-thickness tearing at the articular surface.  Subscapularis and infraspinatus were unremarkable.  Glenohumeral joint was unremarkable.  Findings are consistent with probable partial-thickness supraspinatus tendon tear.  Based on the above findings, I recommended a home exercise program for now but we may need to consider formal physical therapy down the road.  We could also consider a subacromial cortisone injection for pain relief if needed.  I also considered topical nitroglycerin patches but we decided to wait on that for now.  Dotsie will see me again in 4 weeks for reevaluation.

## 2020-10-27 DIAGNOSIS — Z6823 Body mass index (BMI) 23.0-23.9, adult: Secondary | ICD-10-CM | POA: Diagnosis not present

## 2020-10-27 DIAGNOSIS — Z01419 Encounter for gynecological examination (general) (routine) without abnormal findings: Secondary | ICD-10-CM | POA: Diagnosis not present

## 2020-10-27 DIAGNOSIS — Z1231 Encounter for screening mammogram for malignant neoplasm of breast: Secondary | ICD-10-CM | POA: Diagnosis not present

## 2020-11-06 ENCOUNTER — Other Ambulatory Visit: Payer: Self-pay

## 2020-11-06 ENCOUNTER — Ambulatory Visit (INDEPENDENT_AMBULATORY_CARE_PROVIDER_SITE_OTHER): Payer: BC Managed Care – PPO | Admitting: Sports Medicine

## 2020-11-06 VITALS — BP 122/88 | Ht 67.0 in | Wt 148.0 lb

## 2020-11-06 DIAGNOSIS — M7711 Lateral epicondylitis, right elbow: Secondary | ICD-10-CM | POA: Diagnosis not present

## 2020-11-06 MED ORDER — DICLOFENAC SODIUM 75 MG PO TBEC: DELAYED_RELEASE_TABLET | ORAL | 0 refills | Status: AC

## 2020-11-06 NOTE — Progress Notes (Signed)
   Subjective:    Patient ID: Cathy Scott, female    DOB: 1966-03-15, 55 y.o.   MRN: [id number]  HPI chief complaint: Right elbow pain  Bentlie presents today with 5 days of lateral right elbow pain.  Pain began acutely 5 days ago without any trauma.  She is currently being treated for a chronic partial-thickness supraspinatus tendon tear of the right shoulder.  She has been very diligent with doing her home exercises.  Her lateral elbow pain is so severe that she is unable to continue with those exercises at this time.  She states that simple activities such as gripping a glass of water are also uncomfortable.  Pain is unbearable at times.  She has not tried any medication for it.    Review of Systems    As above Objective:   Physical Exam  Well-developed, fit appearing.  No acute distress  Right elbow: Full range of motion.  No effusion.  No soft tissue swelling.  She is exquisitely tender to palpation over the lateral epicondyle.  Reproducible pain with resisted ECRB testing.  Good pulses distally.      Assessment & Plan:   Right elbow pain secondary to common extensor tendinitis Right shoulder rotator cuff tendinopathy/chronic partial-thickness supraspinatus tendon tear  Voltaren 75 mg twice daily with food for 7 days then as needed.  Cock up wrist brace to wear when awake for the next 7 days.  We will also fit her with a body helix compression sleeve and she will start a home exercise program consisting primarily of stretching and light strengthening when tolerated.  Daily icing.  I would like for her to refrain from her rotator cuff exercises for the time being and follow-up with me again in 2 weeks.  If symptoms persist at follow-up, consider ultrasound evaluation or possible cortisone injection.  Call with questions or concerns in the interim.

## 2020-11-13 ENCOUNTER — Ambulatory Visit: Payer: BC Managed Care – PPO | Admitting: Sports Medicine

## 2020-11-20 ENCOUNTER — Ambulatory Visit: Payer: BC Managed Care – PPO | Admitting: Sports Medicine

## 2020-11-27 ENCOUNTER — Other Ambulatory Visit: Payer: Self-pay

## 2020-11-27 ENCOUNTER — Ambulatory Visit (INDEPENDENT_AMBULATORY_CARE_PROVIDER_SITE_OTHER): Payer: BC Managed Care – PPO | Admitting: Sports Medicine

## 2020-11-27 VITALS — BP 114/82

## 2020-11-27 DIAGNOSIS — M7711 Lateral epicondylitis, right elbow: Secondary | ICD-10-CM

## 2020-11-27 DIAGNOSIS — M25511 Pain in right shoulder: Secondary | ICD-10-CM

## 2020-11-27 DIAGNOSIS — G8929 Other chronic pain: Secondary | ICD-10-CM | POA: Diagnosis not present

## 2020-11-27 NOTE — Progress Notes (Signed)
Patient ID: MARGEART ALLENDER, female   DOB: April 06, 1966, 55 y.o.   MRN: [id number]  Tashay comes in today for follow-up.  Right elbow pain has improved but not completely resolved.  Shoulder pain has also improved but she has not been able to do her home exercises secondary to her lateral epicondylitis.  She has been wearing her wrist brace which has been helpful.  Compression sleeve was also helpful initially but she has discontinued it.  She admits that she did not take her Voltaren as prescribed but instead has been taking it as needed which I reassured her is fine. Physical exam was not repeated today.  We simply talked about her ongoing issues.  I think she would benefit from formal physical therapy.  I recommended an appointment with Jenny [facility]mann [facility]'s office for treatment for both rotator cuff tendinopathy as well as her right elbow lateral epicondylitis.  I would like for her to follow-up with me again in 4 to 6 weeks for reevaluation.  If she continues to have significant shoulder pain despite formal PT, we may need to consider MRI.    Of note, she is also describing bilateral knee pain which has historically been worse in the left knee.  She describes catching in the knee and recent x-rays were unremarkable.  This may also be an area that needs further diagnostic imaging if symptoms persist.

## 2020-12-19 DIAGNOSIS — M25521 Pain in right elbow: Secondary | ICD-10-CM | POA: Diagnosis not present

## 2020-12-19 DIAGNOSIS — M25511 Pain in right shoulder: Secondary | ICD-10-CM | POA: Diagnosis not present

## 2020-12-25 ENCOUNTER — Ambulatory Visit: Payer: BC Managed Care – PPO | Admitting: Sports Medicine

## 2020-12-31 DIAGNOSIS — Z85828 Personal history of other malignant neoplasm of skin: Secondary | ICD-10-CM | POA: Diagnosis not present

## 2020-12-31 DIAGNOSIS — L821 Other seborrheic keratosis: Secondary | ICD-10-CM | POA: Diagnosis not present

## 2020-12-31 DIAGNOSIS — L814 Other melanin hyperpigmentation: Secondary | ICD-10-CM | POA: Diagnosis not present

## 2020-12-31 DIAGNOSIS — D225 Melanocytic nevi of trunk: Secondary | ICD-10-CM | POA: Diagnosis not present

## 2021-01-06 ENCOUNTER — Ambulatory Visit: Payer: BC Managed Care – PPO | Admitting: Sports Medicine

## 2021-01-20 ENCOUNTER — Ambulatory Visit: Payer: BC Managed Care – PPO | Admitting: Sports Medicine

## 2021-02-03 ENCOUNTER — Ambulatory Visit: Payer: BC Managed Care – PPO | Admitting: Sports Medicine

## 2021-03-26 ENCOUNTER — Other Ambulatory Visit: Payer: Self-pay

## 2021-03-26 ENCOUNTER — Ambulatory Visit (INDEPENDENT_AMBULATORY_CARE_PROVIDER_SITE_OTHER): Payer: BC Managed Care – PPO | Admitting: Sports Medicine

## 2021-03-26 ENCOUNTER — Ambulatory Visit
Admission: RE | Admit: 2021-03-26 | Discharge: 2021-03-26 | Disposition: A | Discharge status: Home / Self Care | Payer: BC Managed Care – PPO | Source: Ambulatory Visit | Attending: Sports Medicine | Admitting: Sports Medicine

## 2021-03-26 VITALS — Ht 67.0 in | Wt 145.0 lb

## 2021-03-26 DIAGNOSIS — M25572 Pain in left ankle and joints of left foot: Secondary | ICD-10-CM

## 2021-03-26 DIAGNOSIS — S93492A Sprain of other ligament of left ankle, initial encounter: Secondary | ICD-10-CM | POA: Diagnosis not present

## 2021-03-26 NOTE — Progress Notes (Signed)
   Subjective:    Patient ID: Cathy Scott, female    DOB: [birth year], 55 y.o.   MRN: [id number]  HPI chief complaint: Left foot and ankle pain  Cathy Scott presents today complaining of lateral left foot and ankle pain that began this morning after she suffered an inversion injury at home.  She localizes most of her pain along the lateral foot.  She also fell and scraped her right knee.  She has developed some mild swelling around the ankle.  Denies any pain along the medial ankle.  She did have a fifth metatarsal fracture in 1989 but she is not sure which foot.  No prior foot or ankle surgeries.  Past medical history reviewed Medications reviewed Allergies reviewed    Review of Systems As above    Objective:   Physical Exam  Well-developed, well-nourished.  No acute distress  Left ankle: Full range of motion.  No effusion.  There is some slight soft tissue swelling along the lateral ankle.  She is tender to palpation near the area of the ATF.  No tenderness at the posterior fibula.  Tender to palpation along the peroneal tendon along the lateral foot as well as at the base of the fifth metatarsal.  Negative talar tilt, positive anterior drawer.  Good pulses.  Good strength but she does have reproducible pain with stretching of the peroneal brevis tendon.  Right knee: Full range of motion.  No effusion.  Extensor mechanism is intact.  There is a small abrasion on the anterior knee.  No evidence of prepatellar bursal swelling.  X-rays of the left ankle including AP, lateral, and mortise view show no obvious fracture.  X-rays of the left foot including AP, lateral, and oblique views show no fracture.  Specifically no obvious fracture at the base of the fifth metatarsal.  Please note that radiology read was pending at the time of this dictation.      Assessment & Plan:   Left ankle sprain and peroneal tendon strain  Reassurance regarding her x-rays.  Body helix compression to be worn when  active.  Home exercise program.  She will ice and elevate over the next 24 to 48 hours.  Over-the-counter analgesics as needed.  She may increase activity as tolerated.  I recommended that she wear the compression sleeve on her left ankle when on uneven ground for the next 3 months or so.  If the radiologist's read of the x-rays is different than mine then we will notify Cathy Scott via telephone.  Otherwise, she will follow-up for ongoing or recalcitrant issues.

## 2021-03-31 ENCOUNTER — Ambulatory Visit: Payer: BC Managed Care – PPO | Admitting: Sports Medicine

## 2021-05-13 DIAGNOSIS — Z23 Encounter for immunization: Secondary | ICD-10-CM | POA: Diagnosis not present

## 2021-06-29 DIAGNOSIS — F4323 Adjustment disorder with mixed anxiety and depressed mood: Secondary | ICD-10-CM | POA: Diagnosis not present

## 2021-08-03 DIAGNOSIS — F4323 Adjustment disorder with mixed anxiety and depressed mood: Secondary | ICD-10-CM | POA: Diagnosis not present

## 2021-08-31 DIAGNOSIS — F4323 Adjustment disorder with mixed anxiety and depressed mood: Secondary | ICD-10-CM | POA: Diagnosis not present

## 2021-11-26 DIAGNOSIS — F4323 Adjustment disorder with mixed anxiety and depressed mood: Secondary | ICD-10-CM | POA: Diagnosis not present

## 2022-01-11 DIAGNOSIS — N912 Amenorrhea, unspecified: Secondary | ICD-10-CM | POA: Diagnosis not present

## 2022-01-11 DIAGNOSIS — Z1231 Encounter for screening mammogram for malignant neoplasm of breast: Secondary | ICD-10-CM | POA: Diagnosis not present

## 2022-01-11 DIAGNOSIS — Z6824 Body mass index (BMI) 24.0-24.9, adult: Secondary | ICD-10-CM | POA: Diagnosis not present

## 2022-01-11 DIAGNOSIS — Z01419 Encounter for gynecological examination (general) (routine) without abnormal findings: Secondary | ICD-10-CM | POA: Diagnosis not present

## 2022-01-12 ENCOUNTER — Ambulatory Visit (INDEPENDENT_AMBULATORY_CARE_PROVIDER_SITE_OTHER): Payer: BC Managed Care – PPO | Admitting: Sports Medicine

## 2022-01-12 VITALS — BP 136/78 | Ht 67.0 in | Wt 150.0 lb

## 2022-01-12 DIAGNOSIS — M7542 Impingement syndrome of left shoulder: Secondary | ICD-10-CM

## 2022-01-12 DIAGNOSIS — M25512 Pain in left shoulder: Secondary | ICD-10-CM

## 2022-01-12 NOTE — Progress Notes (Signed)
   Subjective:    Patient ID: Cathy Scott, female    DOB: 1966/03/04, 56 y.o.   MRN: [id number]  HPI chief complaint: Left shoulder pain  Cathy Scott presents today complaining of intermittent left shoulder pain this been present now for several months.  It became acutely worse yesterday.  She had significant pain when sitting in her beachchair with her shoulder and arm in an abducted and externally rotated position.  She notices most of her pain is along the lateral aspect of the shoulder and is most noticeable with the extreme of external rotation as well as with active internal rotation.  She does endorse some nighttime pain as well.  She had some right shoulder pain in March 2022.  An ultrasound of her right shoulder at that time showed some findings consistent with a probable partial supraspinatus tendon tear.  Her right shoulder is currently tolerable.  She does not recall any specific injury to the left shoulder.  She enjoys Zumba and yoga and notices that certain exercises that will axial load the glenohumeral joint such as planks cause pain.  She is careful to avoid repetitive overhead resistance exercises.  Intra medical history reviewed Medications reviewed Allergies reviewed  Review of Systems As above    Objective:   Physical Exam  Well-developed, well-nourished.  No acute distress  Left shoulder: Full range of motion.  No tenderness to palpation.  Positive empty can, positive Hawkins.  Her rotator cuff strength is 5/5 but does reproduce some pain with resisted supraspinatus.  Negative O'Brien's.  Neurovascularly intact distally.      Assessment & Plan:   Left shoulder pain likely secondary to rotator cuff impingement/tendinopathy  Cathy Scott has done well with Cathy Scott in the past.  Most recently, he was able to treat her lateral epicondylitis.  I think she would benefit from a referral to him for her left shoulder.  We discussed which specific exercises to avoid such as  straight arm planks or any other exercises that will axial load the joint.  She will follow-up with me again in 4 weeks for reevaluation.  If symptoms persist despite starting physical therapy then we will plan on an ultrasound evaluation of the left shoulder at that time.  She will call with questions or concerns in the interim.  This note was dictated using Dragon naturally speaking software and may contain errors in syntax, spelling, or content which have not been identified prior to signing this note.

## 2022-02-09 ENCOUNTER — Ambulatory Visit: Payer: BC Managed Care – PPO | Admitting: Sports Medicine

## 2022-02-10 DIAGNOSIS — M25512 Pain in left shoulder: Secondary | ICD-10-CM | POA: Diagnosis not present

## 2022-03-09 IMAGING — DX DG KNEE AP/LAT W/ SUNRISE*R*
3 series · 3 of 3 positions shown · non-contrast
Comparison: None.

CLINICAL DATA: Bilateral knee pain, chronic

EXAM:
RIGHT KNEE 3 VIEWS

[dg knee ap/lat w/ sunrise right (1 of 3)]
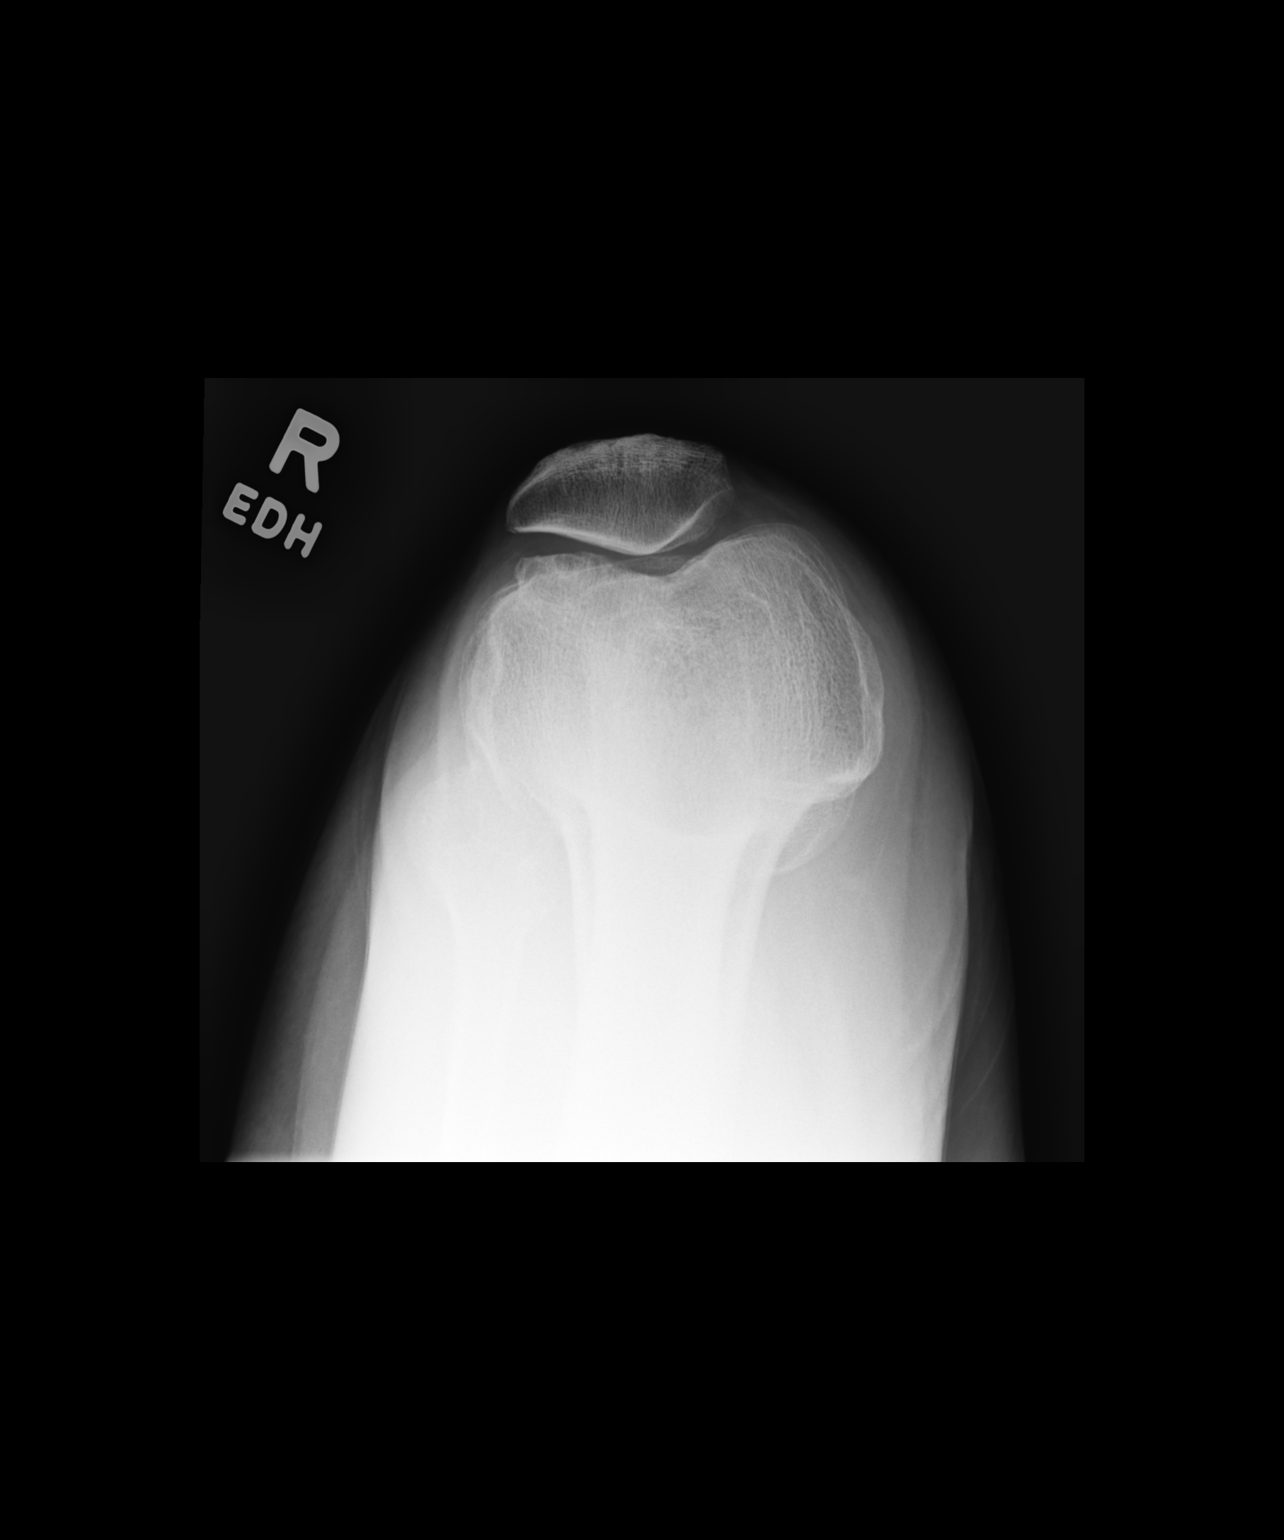

[dg knee ap/lat w/ sunrise right (2 of 3)]
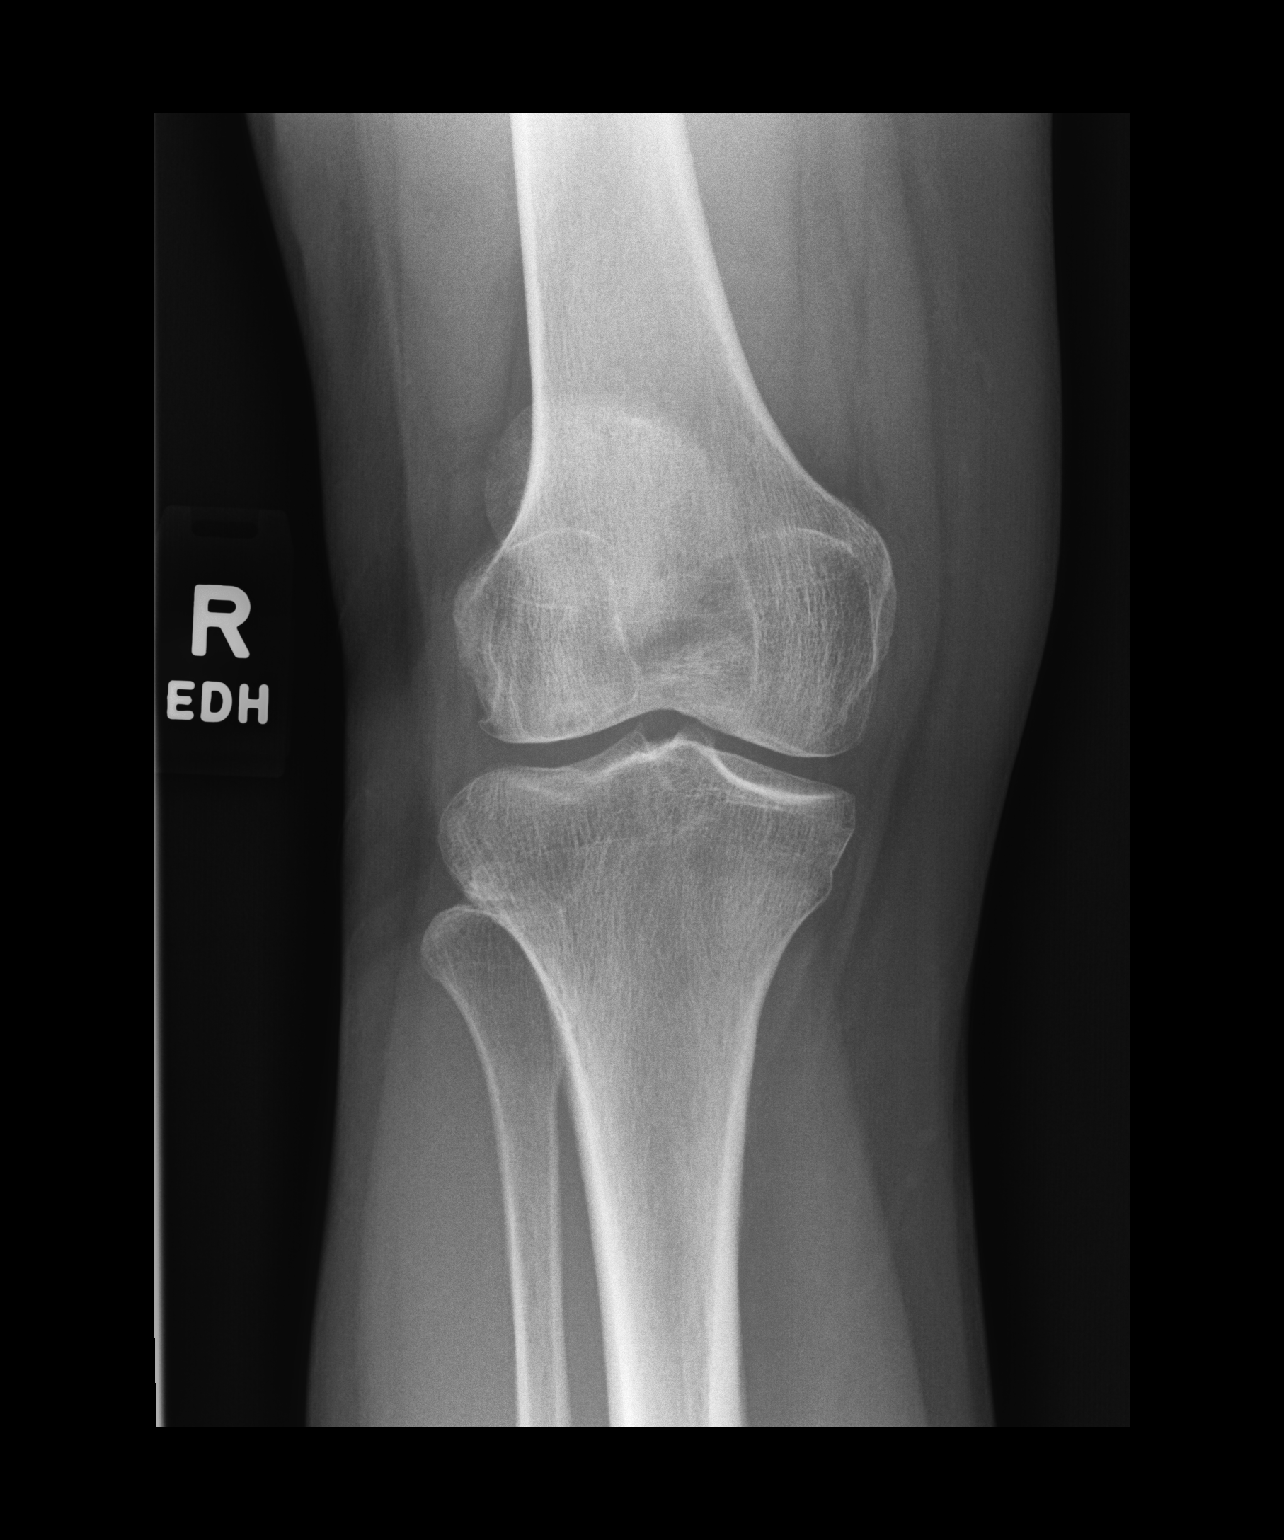

[dg knee ap/lat w/ sunrise right (3 of 3)]
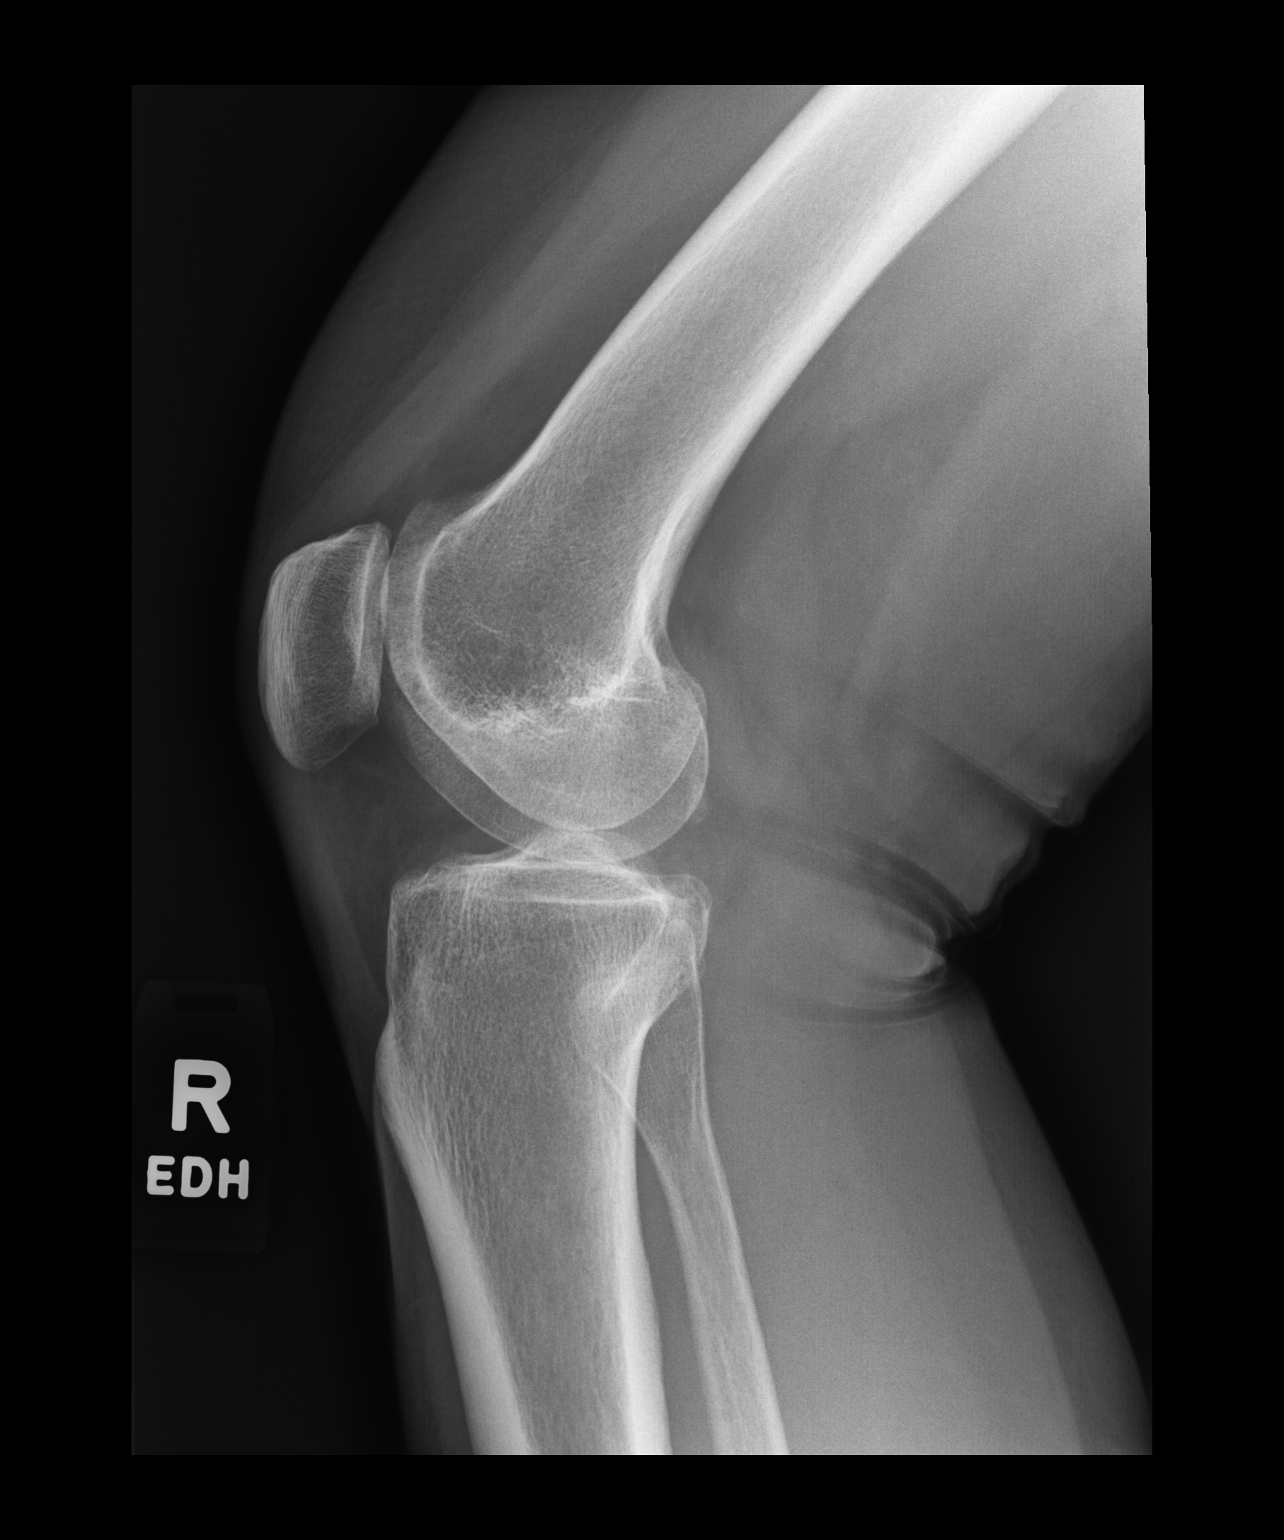

[3 of 3 positions shown; findings below may reference images not displayed]

FINDINGS: Minimal degenerative marginal spurring at the lateral compartment
and tibial spines. Probable mild patellofemoral compartment
narrowing along the lateral facet where there is suggestion of
subchondral cysts on the sunrise view. No evidence of fracture,
joint effusion, or subluxation.
IMPRESSION: Mild degenerative spurring and probable patellofemoral compartment
narrowing.

## 2022-03-09 IMAGING — DX DG KNEE AP/LAT W/ SUNRISE*L*
3 series · 3 of 3 positions shown · non-contrast
Comparison: None.

CLINICAL DATA: Bilateral knee pain

EXAM:
LEFT KNEE 3 VIEWS

[dg knee ap/lat w/ sunrise left (1 of 3)]
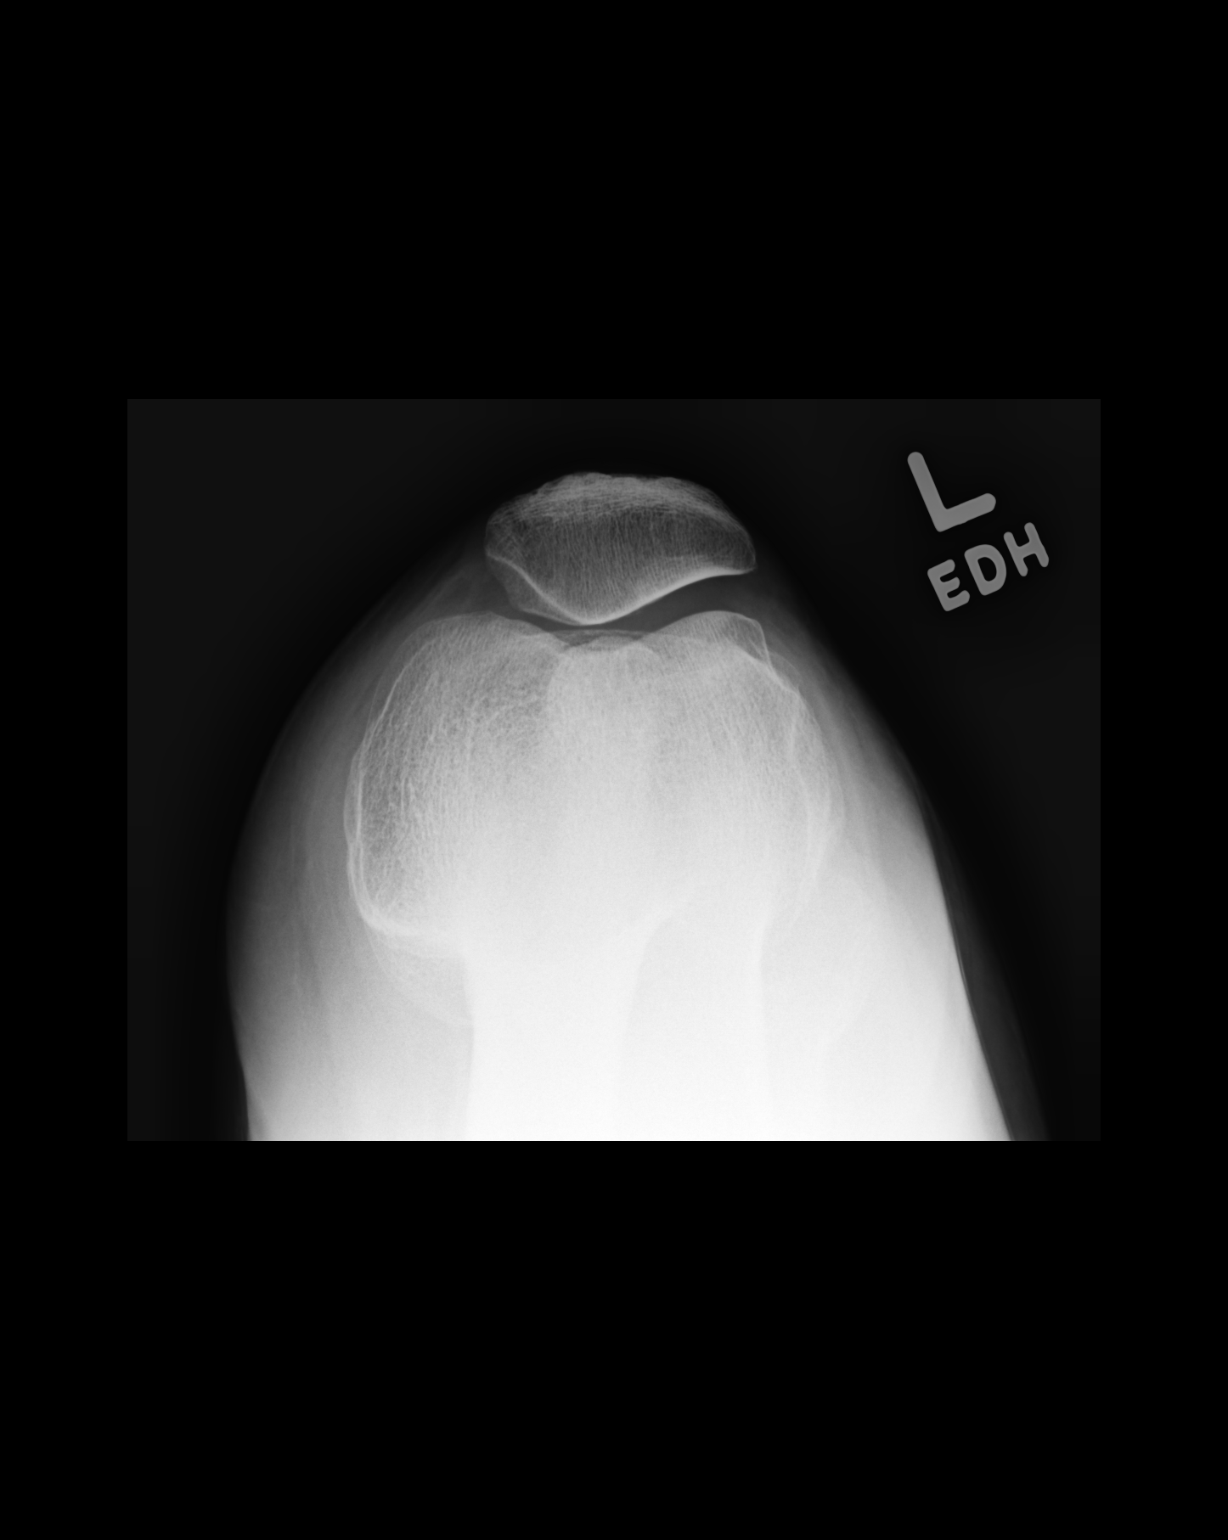

[dg knee ap/lat w/ sunrise left (2 of 3)]
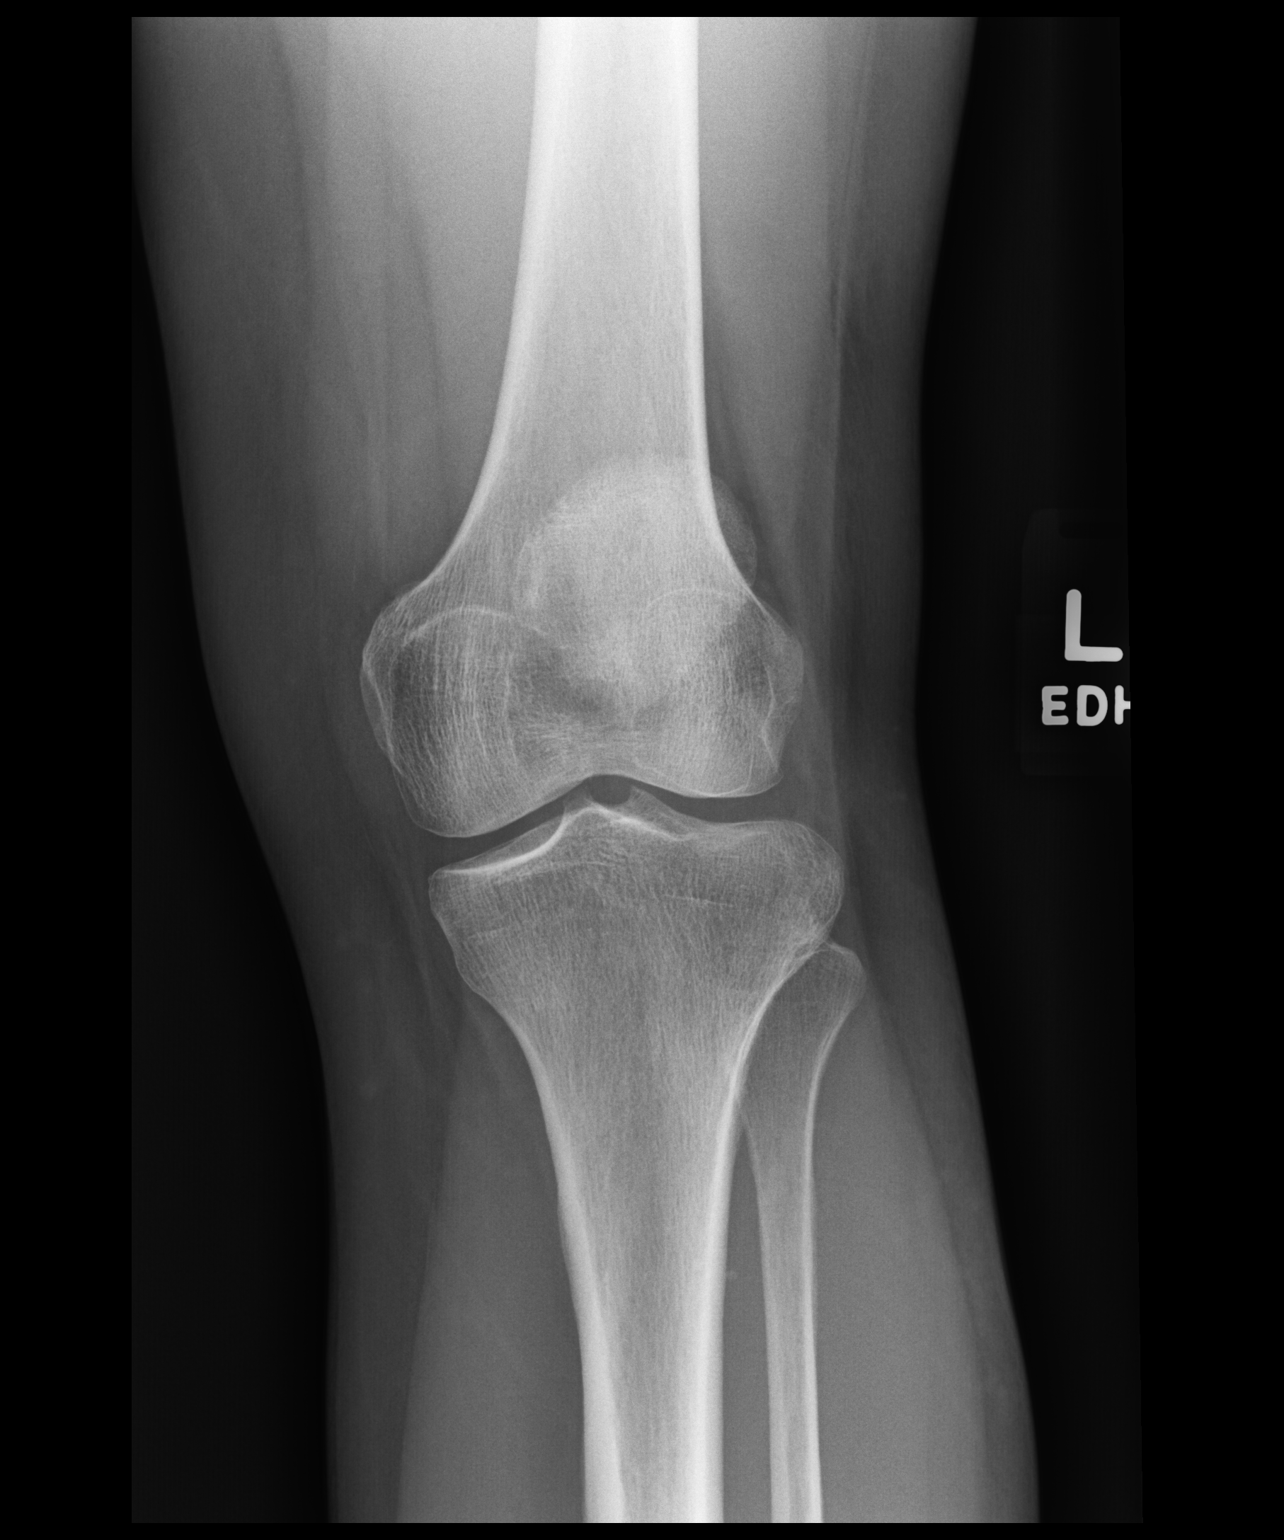

[dg knee ap/lat w/ sunrise left (3 of 3)]
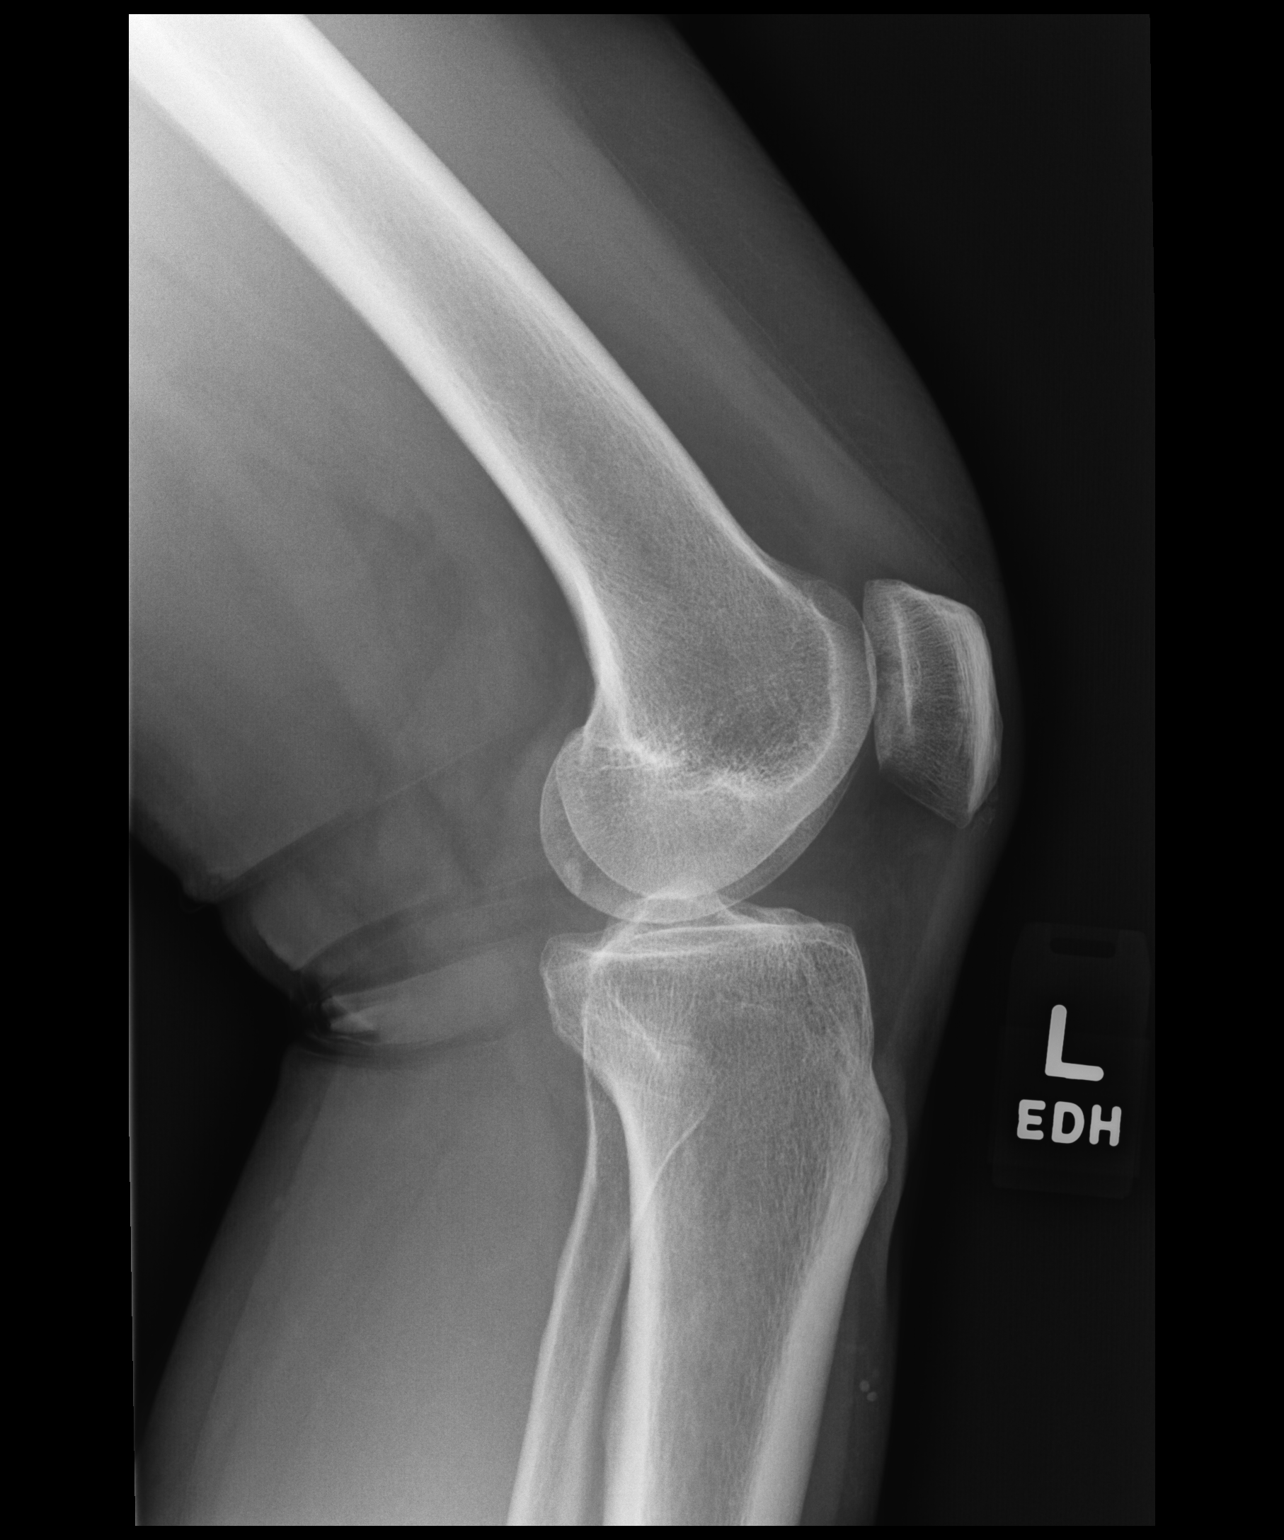

[3 of 3 positions shown; findings below may reference images not displayed]

FINDINGS: No evidence of fracture, dislocation, or joint effusion. No joint
narrowing or notable spurring.
IMPRESSION: Negative.

## 2022-06-22 DIAGNOSIS — L821 Other seborrheic keratosis: Secondary | ICD-10-CM | POA: Diagnosis not present

## 2022-07-19 DIAGNOSIS — L814 Other melanin hyperpigmentation: Secondary | ICD-10-CM | POA: Diagnosis not present

## 2022-07-19 DIAGNOSIS — Z85828 Personal history of other malignant neoplasm of skin: Secondary | ICD-10-CM | POA: Diagnosis not present

## 2022-07-19 DIAGNOSIS — D225 Melanocytic nevi of trunk: Secondary | ICD-10-CM | POA: Diagnosis not present

## 2022-07-19 DIAGNOSIS — L821 Other seborrheic keratosis: Secondary | ICD-10-CM | POA: Diagnosis not present

## 2022-08-25 DIAGNOSIS — F419 Anxiety disorder, unspecified: Secondary | ICD-10-CM | POA: Diagnosis not present

## 2022-08-25 DIAGNOSIS — Z0000 Encounter for general adult medical examination without abnormal findings: Secondary | ICD-10-CM | POA: Diagnosis not present

## 2022-08-25 DIAGNOSIS — E559 Vitamin D deficiency, unspecified: Secondary | ICD-10-CM | POA: Diagnosis not present

## 2022-08-25 DIAGNOSIS — Z13220 Encounter for screening for lipoid disorders: Secondary | ICD-10-CM | POA: Diagnosis not present

## 2022-09-08 DIAGNOSIS — F4323 Adjustment disorder with mixed anxiety and depressed mood: Secondary | ICD-10-CM | POA: Diagnosis not present

## 2023-01-13 DIAGNOSIS — Z6824 Body mass index (BMI) 24.0-24.9, adult: Secondary | ICD-10-CM | POA: Diagnosis not present

## 2023-01-13 DIAGNOSIS — Z30432 Encounter for removal of intrauterine contraceptive device: Secondary | ICD-10-CM | POA: Diagnosis not present

## 2023-01-13 DIAGNOSIS — Z01419 Encounter for gynecological examination (general) (routine) without abnormal findings: Secondary | ICD-10-CM | POA: Diagnosis not present

## 2023-01-13 DIAGNOSIS — Z13820 Encounter for screening for osteoporosis: Secondary | ICD-10-CM | POA: Diagnosis not present

## 2023-01-13 DIAGNOSIS — Z1231 Encounter for screening mammogram for malignant neoplasm of breast: Secondary | ICD-10-CM | POA: Diagnosis not present

## 2023-05-16 ENCOUNTER — Ambulatory Visit (INDEPENDENT_AMBULATORY_CARE_PROVIDER_SITE_OTHER): Payer: BC Managed Care – PPO | Admitting: Podiatry

## 2023-05-16 DIAGNOSIS — D2372 Other benign neoplasm of skin of left lower limb, including hip: Secondary | ICD-10-CM

## 2023-05-17 NOTE — Progress Notes (Signed)
   Chief Complaint  Patient presents with   Callouses    CORN BETWEEN 4TH AND 5TH TOE STARTED 4 MONTHS AGO WORE CORN PADS FOR A 2.5 WKS AND IT WENT AWAY, THEN IT CAME BACK 4 DAYS AGO, HAVING PAIN    Subjective: 57 y.o. female presenting to the office today as a new patient for evaluation of pain and tenderness associated to the fifth digit of the left foot.  Originally noticed the skin lesion earlier this summer and she applied salicylic acid and it seemed to go away.  Over the past 2-3 weeks the patient has had a recurrence of the pain and tenderness associated to the callus lesion.  She presents for further treatment and evaluation   Past Medical History:  Diagnosis Date   Skin cancer of nose     Past Surgical History:  Procedure Laterality Date   CYSTECTOMY     from foot at age 38   SKIN CANCER EXCISION  2005   nose    Allergies  Allergen Reactions   Penicillins Hives    REACTION: rash     Objective:  Physical Exam General: Alert and oriented x3 in no acute distress  Dermatology: Hyperkeratotic lesion(s) present on the medial aspect of the left fifth digit. Pain on palpation with a central nucleated core noted. Skin is warm, dry and supple bilateral lower extremities. Negative for open lesions or macerations.  Vascular: Palpable pedal pulses bilaterally. No edema or erythema noted. Capillary refill within normal limits.  Neurological: Grossly intact via light touch  Musculoskeletal Exam: Pain on palpation at the keratotic lesion(s) noted. Range of motion within normal limits bilateral. Muscle strength 5/5 in all groups bilateral.  Assessment: 1.  Symptomatic benign skin lesion medial aspect of the left fifth toe   Plan of Care:  -Patient evaluated -Excisional debridement of keratoic lesion(s) using a chisel blade was performed without incident.  -Salicylic acid applied with a bandaid -Silicone toe spacers were provided and dispensed patient to alleviate pressure  between the fifth digit and the adjacent toe -Continue wearing wide fitting shoes that do not constrict the toebox area -Return to the clinic PRN.   Felecia Shelling, DPM [facility]  Dr. Felecia Shelling, DPM    [address] [place], [place] [address]                Office [phone]  Fax [phone]
